# Patient Record
Sex: Female | Born: 1960
Health system: Southern US, Community
[De-identification: ages and names within clinical notes are randomized; demographics above are authoritative.]

## PROBLEM LIST (undated history)

## (undated) DIAGNOSIS — A6009 Herpesviral infection of other urogenital tract: Secondary | ICD-10-CM

## (undated) DIAGNOSIS — Z85828 Personal history of other malignant neoplasm of skin: Secondary | ICD-10-CM

## (undated) DIAGNOSIS — G4700 Insomnia, unspecified: Secondary | ICD-10-CM

## (undated) DIAGNOSIS — M94269 Chondromalacia, unspecified knee: Secondary | ICD-10-CM

## (undated) DIAGNOSIS — F319 Bipolar disorder, unspecified: Secondary | ICD-10-CM

## (undated) DIAGNOSIS — M1990 Unspecified osteoarthritis, unspecified site: Secondary | ICD-10-CM

## (undated) DIAGNOSIS — E079 Disorder of thyroid, unspecified: Secondary | ICD-10-CM

## (undated) DIAGNOSIS — M222X9 Patellofemoral disorders, unspecified knee: Secondary | ICD-10-CM

## (undated) DIAGNOSIS — E559 Vitamin D deficiency, unspecified: Secondary | ICD-10-CM

## (undated) HISTORY — PX: LAPAROSCOPIC OOPHERECTOMY: SHX6507

## (undated) HISTORY — DX: Disorder of thyroid, unspecified: E07.9

## (undated) HISTORY — DX: Patellofemoral disorders, unspecified knee: M22.2X9

## (undated) HISTORY — PX: TUBAL LIGATION: SHX77

## (undated) HISTORY — DX: Vitamin D deficiency, unspecified: E55.9

## (undated) HISTORY — DX: Personal history of other malignant neoplasm of skin: Z85.828

## (undated) HISTORY — DX: Bipolar disorder, unspecified: F31.9

## (undated) HISTORY — DX: Chondromalacia, unspecified knee: M94.269

## (undated) HISTORY — DX: Unspecified osteoarthritis, unspecified site: M19.90

## (undated) HISTORY — DX: Insomnia, unspecified: G47.00

## (undated) HISTORY — DX: Herpesviral infection of other urogenital tract: A60.09

## (undated) HISTORY — PX: AUGMENTATION MAMMAPLASTY: SUR837

---

## 1982-03-30 HISTORY — PX: BREAST SURGERY: SHX581

## 1999-12-17 ENCOUNTER — Other Ambulatory Visit: Admission: RE | Admit: 1999-12-17 | Discharge: 1999-12-17 | Payer: Self-pay | Admitting: *Deleted

## 2006-02-03 ENCOUNTER — Ambulatory Visit: Payer: Self-pay | Admitting: Internal Medicine

## 2009-05-15 ENCOUNTER — Ambulatory Visit: Payer: Self-pay | Admitting: Family Medicine

## 2009-09-18 DIAGNOSIS — E559 Vitamin D deficiency, unspecified: Secondary | ICD-10-CM | POA: Insufficient documentation

## 2010-12-11 ENCOUNTER — Ambulatory Visit: Payer: Self-pay | Admitting: Unknown Physician Specialty

## 2011-05-29 HISTORY — PX: BILATERAL OOPHORECTOMY: SHX1221

## 2011-06-03 ENCOUNTER — Ambulatory Visit: Payer: Self-pay | Admitting: Obstetrics and Gynecology

## 2011-06-03 LAB — PREGNANCY, URINE: Pregnancy Test, Urine: NEGATIVE m[IU]/mL

## 2011-06-03 LAB — BASIC METABOLIC PANEL
Anion Gap: 8 (ref 7–16)
Calcium, Total: 9.2 mg/dL (ref 8.5–10.1)
Chloride: 105 mmol/L (ref 98–107)
Co2: 27 mmol/L (ref 21–32)
EGFR (African American): >60
EGFR (Non-African Amer.): >60
Glucose: 85 mg/dL (ref 65–99)
Osmolality: 280 (ref 275–301)
Sodium: 140 mmol/L (ref 136–145)

## 2011-06-03 LAB — CBC
HCT: 40.7 % (ref 35.0–47.0)
HGB: 13.6 g/dL (ref 12.0–16.0)
MCV: 93 fL (ref 80–100)
Platelet: 238 10*3/uL (ref 150–440)
RBC: 4.39 10*6/uL (ref 3.80–5.20)
WBC: 7.2 10*3/uL (ref 3.6–11.0)

## 2011-06-16 ENCOUNTER — Ambulatory Visit: Payer: Self-pay | Admitting: Obstetrics and Gynecology

## 2011-06-19 LAB — PATHOLOGY REPORT

## 2012-07-27 LAB — LIPID PANEL
CHOLESTEROL: 208 mg/dL — AB (ref 0–200)
HDL: 80 mg/dL — AB (ref 35–70)
LDL Cholesterol: 115 mg/dL
TRIGLYCERIDES: 64 mg/dL (ref 40–160)

## 2013-01-10 DIAGNOSIS — Z85828 Personal history of other malignant neoplasm of skin: Secondary | ICD-10-CM | POA: Insufficient documentation

## 2013-01-10 DIAGNOSIS — C4401 Basal cell carcinoma of skin of lip: Secondary | ICD-10-CM | POA: Insufficient documentation

## 2013-01-28 HISTORY — PX: MOHS SURGERY: SUR867

## 2014-03-30 LAB — HM PAP SMEAR: HM Pap smear: NORMAL

## 2014-03-30 LAB — HM MAMMOGRAPHY: HM MAMMO: NORMAL

## 2014-11-29 ENCOUNTER — Ambulatory Visit (INDEPENDENT_AMBULATORY_CARE_PROVIDER_SITE_OTHER): Payer: PRIVATE HEALTH INSURANCE | Admitting: Family Medicine

## 2014-11-29 ENCOUNTER — Encounter (INDEPENDENT_AMBULATORY_CARE_PROVIDER_SITE_OTHER): Payer: Self-pay

## 2014-11-29 ENCOUNTER — Encounter: Payer: Self-pay | Admitting: Family Medicine

## 2014-11-29 VITALS — BP 114/68 | HR 93 | Temp 98.2°F | Resp 18 | Ht 64.0 in | Wt 170.8 lb

## 2014-11-29 DIAGNOSIS — E038 Other specified hypothyroidism: Secondary | ICD-10-CM

## 2014-11-29 DIAGNOSIS — Z85828 Personal history of other malignant neoplasm of skin: Secondary | ICD-10-CM

## 2014-11-29 DIAGNOSIS — Z1211 Encounter for screening for malignant neoplasm of colon: Secondary | ICD-10-CM | POA: Diagnosis not present

## 2014-11-29 DIAGNOSIS — Z13220 Encounter for screening for lipoid disorders: Secondary | ICD-10-CM

## 2014-11-29 DIAGNOSIS — E039 Hypothyroidism, unspecified: Secondary | ICD-10-CM | POA: Insufficient documentation

## 2014-11-29 DIAGNOSIS — R072 Precordial pain: Secondary | ICD-10-CM | POA: Diagnosis not present

## 2014-11-29 DIAGNOSIS — E663 Overweight: Secondary | ICD-10-CM | POA: Diagnosis not present

## 2014-11-29 DIAGNOSIS — Z780 Asymptomatic menopausal state: Secondary | ICD-10-CM | POA: Insufficient documentation

## 2014-11-29 DIAGNOSIS — F3342 Major depressive disorder, recurrent, in full remission: Secondary | ICD-10-CM | POA: Insufficient documentation

## 2014-11-29 DIAGNOSIS — M222X9 Patellofemoral disorders, unspecified knee: Secondary | ICD-10-CM | POA: Insufficient documentation

## 2014-11-29 DIAGNOSIS — E559 Vitamin D deficiency, unspecified: Secondary | ICD-10-CM | POA: Diagnosis not present

## 2014-11-29 DIAGNOSIS — F319 Bipolar disorder, unspecified: Secondary | ICD-10-CM

## 2014-11-29 DIAGNOSIS — Z23 Encounter for immunization: Secondary | ICD-10-CM

## 2014-11-29 DIAGNOSIS — M25569 Pain in unspecified knee: Secondary | ICD-10-CM | POA: Insufficient documentation

## 2014-11-29 DIAGNOSIS — R002 Palpitations: Secondary | ICD-10-CM | POA: Diagnosis not present

## 2014-11-29 DIAGNOSIS — A6000 Herpesviral infection of urogenital system, unspecified: Secondary | ICD-10-CM | POA: Insufficient documentation

## 2014-11-29 HISTORY — DX: Personal history of other malignant neoplasm of skin: Z85.828

## 2014-11-29 MED ORDER — ASPIRIN EC 81 MG PO TBEC: 81.0000 mg | DELAYED_RELEASE_TABLET | Freq: Every day | ORAL | Status: DC

## 2014-11-29 MED ORDER — CLONAZEPAM 0.5 MG PO TABS: 0.5000 mg | ORAL_TABLET | Freq: Two times a day (BID) | ORAL | Status: DC | PRN

## 2014-11-29 MED ORDER — BUPROPION HCL ER (XL) 150 MG PO TB24: 150.0000 mg | ORAL_TABLET | Freq: Every day | ORAL | Status: DC

## 2014-11-29 MED ORDER — ARIPIPRAZOLE 5 MG PO TABS: 5.0000 mg | ORAL_TABLET | Freq: Every day | ORAL | Status: DC

## 2014-11-29 NOTE — Progress Notes (Signed)
Name: Sandra Chandler   MRN: [id number]    DOB: 1961/03/19   Date:11/29/2014       Progress Note  Subjective  Chief Complaint  Chief Complaint  Patient presents with  . Medication Refill    4 month F/U  . Hypothyroidism    Unchanged but is having extremely Dry Skin  . 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 Patient has been very nervous and heart races constantly.     HPI  Hypothyroidism: taking Levothyroxine 50 mcg daily, she has noticed weight gain and dry skin, but denies constipation. She is compliant with medication   Bipolar Disorder: she lost her job about 2 months ago, abo. She is back working for the past week, difficulty staying focused, feeling overwhelmed, denies crying spells ( but cried in the office today) . She also feels very anxious. She was diagnosed with bipolar about 25 years ago and has not been on any medication since. She would like to try something that does not cause weight gain  Chest pain/palpitation: she states symptoms started about 6 weeks ago, after the loss of her job. She has been feeling very anxious. The pain is described as a discomfort with fluttering sensation in her anterior chest. No radiation to neck or shoulders/arms. No diaphoresis or nausea, no SOB. She has no previous history of cardiac disease. No family history of early MI  Patient Active Problem List   Diagnosis Date Noted  . Affective bipolar disorder 11/29/2014  . Genital herpes 11/29/2014  . History of basal cell cancer 11/29/2014  . Adult hypothyroidism 11/29/2014  . Menopause 11/29/2014  . Patella-femoral syndrome 11/29/2014  . Overweight (BMI 25.0-29.9) 11/29/2014  . Vitamin D deficiency 09/18/2009    Past Surgical History  Procedure Laterality Date  . Tubal ligation    . Cesarean section      x2  . Breast surgery  1984  . Bilateral oophorectomy  05/2011  . Mohs surgery  01/28/2013    Family History  Problem Relation Age of Onset  .  Osteoporosis Mother   . Bipolar disorder Father   . Cancer Father     Lung  . Bipolar disorder Brother     Social History   Social History  . Marital Status: Married    Spouse Name: N/A  . Number of Children: N/A  . Years of Education: N/A   Occupational History  . Not on file.   Social History Main Topics  . Smoking status: Never Smoker   . Smokeless tobacco: Never Used  . Alcohol Use: 0.0 oz/week    0 Standard drinks or equivalent per week     Comment: a little  . Drug Use: No  . Sexual Activity: No   Other Topics Concern  . Not on file   Social History Narrative     Current outpatient prescriptions:  .  clonazePAM (KLONOPIN) 0.5 MG tablet, Take 1 tablet (0.5 mg total) by mouth 2 (two) times daily as needed for anxiety., Disp: 60 tablet, Rfl: 0 .  levothyroxine (SYNTHROID, LEVOTHROID) 50 MCG tablet, Take by mouth., Disp: , Rfl:  .  ARIPiprazole (ABILIFY) 5 MG tablet, Take 1 tablet (5 mg total) by mouth daily., Disp: 30 tablet, Rfl: 0 .  buPROPion (WELLBUTRIN XL) 150 MG 24 hr tablet, Take 1 tablet (150 mg total) by mouth daily., Disp: 30 tablet, Rfl: 0  No Known Allergies   ROS  Constitutional: Negative for  fever or weight change.  Respiratory: Negative for cough and shortness of breath.   Cardiovascular: Positive  for chest pain or palpitations.  Gastrointestinal: Negative for abdominal pain, no bowel changes.  Musculoskeletal: Negative for gait problem or joint swelling.  Skin: Negative for rash.  Neurological: Negative for dizziness or headache.  No other specific complaints in a complete review of systems (except as listed in HPI above).  Objective  Filed Vitals:   11/29/14 0812  BP: 114/68  Pulse: 93  Temp: 98.2 F (36.8 C)  TempSrc: Oral  Resp: 18  Height: 5\' 4"  (1.626 m)  Weight: 170 lb 12.8 oz (77.474 kg)  SpO2: 96%    Body mass index is 29.3 kg/(m^2).  Physical Exam  Constitutional: Patient appears well-developed and well-nourished.   No distress.  HEENT: head atraumatic, normocephalic, pupils equal and reactive to light, neck supple, throat within normal limits Cardiovascular: Normal rate, regular rhythm and normal heart sounds.  No murmur heard. No BLE edema. Pulmonary/Chest: Effort normal and breath sounds normal. No respiratory distress. Abdominal: Soft.  There is no tenderness. Psychiatric: Patient has a normal mood and affect. behavior is normal. Judgment and thought content normal.  PHQ2/9: Depression screen PHQ 2/9 11/29/2014  Decreased Interest 0  Down, Depressed, Hopeless 0  PHQ - 2 Score 0     Fall Risk: Fall Risk  11/29/2014  Falls in the past year? No      Assessment & Plan  1. Other specified hypothyroidism  - TSH  2. Needs flu shot  - Flu Vaccine QUAD 36+ mos PF IM (Fluarix & Fluzone Quad PF)  3. Bipolar affective disorder, most recent episode unspecified type, remission status unspecified Explained the need for a mood stabilizer so she does not go manic - clonazePAM (KLONOPIN) 0.5 MG tablet; Take 1 tablet (0.5 mg total) by mouth 2 (two) times daily as needed for anxiety.  Dispense: 60 tablet; Refill: 0 - ARIPiprazole (ABILIFY) 5 MG tablet; Take 1 tablet (5 mg total) by mouth daily.  Dispense: 30 tablet; Refill: 0 - buPROPion (WELLBUTRIN XL) 150 MG 24 hr tablet; Take 1 tablet (150 mg total) by mouth daily.  Dispense: 30 tablet; Refill: 0  4. Vitamin D deficiency  - Vit D  25 hydroxy (rtn osteoporosis monitoring)  5. Lipid screening  - Lipid panel  6. Overweight (BMI 25.0-29.9) Discussed with the patient the risk posed by an increased BMI. Discussed importance of portion control, calorie counting and at least 150 minutes of physical activity weekly. Avoid sweet beverages and drink more water. Eat at least 6 servings of fruit and vegetables daily    7. Palpitation  - Comprehensive metabolic panel - TSH - CBC with Differential/Platelet - EKG 12-Lead  8. Colon cancer screening She  does not want to have a colonoscopy at this time because of cost - Cologuard   9. Precordial pain  With EKG changed, we will check Cardiac enzymes and refer to Cardiologist , start aspirin  - Troponin I - CK total and CKMB (cardiac)not at [facility] - Ambulatory referral to Cardiology

## 2014-11-30 LAB — LIPID PANEL
CHOLESTEROL TOTAL: 219 mg/dL — AB (ref 100–199)
Chol/HDL Ratio: 3.2 ratio units (ref 0.0–4.4)
HDL: 69 mg/dL (ref >39)
LDL CALC: 134 mg/dL — AB (ref 0–99)
Triglycerides: 82 mg/dL (ref 0–149)
VLDL CHOLESTEROL CAL: 16 mg/dL (ref 5–40)

## 2014-11-30 LAB — CBC WITH DIFFERENTIAL/PLATELET
BASOS ABS: 0 10*3/uL (ref 0.0–0.2)
Basos: 1 %
EOS (ABSOLUTE): 0.1 10*3/uL (ref 0.0–0.4)
Eos: 2 %
Hematocrit: 39.7 % (ref 34.0–46.6)
Hemoglobin: 13.3 g/dL (ref 11.1–15.9)
IMMATURE GRANS (ABS): 0 10*3/uL (ref 0.0–0.1)
IMMATURE GRANULOCYTES: 0 %
LYMPHS: 27 %
Lymphocytes Absolute: 1.7 10*3/uL (ref 0.7–3.1)
MCH: 29.8 pg (ref 26.6–33.0)
MCHC: 33.5 g/dL (ref 31.5–35.7)
MCV: 89 fL (ref 79–97)
MONOCYTES: 10 %
Monocytes Absolute: 0.6 10*3/uL (ref 0.1–0.9)
NEUTROS PCT: 60 %
Neutrophils Absolute: 3.7 10*3/uL (ref 1.4–7.0)
PLATELETS: 265 10*3/uL (ref 150–379)
RBC: 4.46 x10E6/uL (ref 3.77–5.28)
RDW: 13.5 % (ref 12.3–15.4)
WBC: 6.1 10*3/uL (ref 3.4–10.8)

## 2014-11-30 LAB — CK TOTAL AND CKMB (NOT AT ARMC)
CK MB INDEX: 1.3 ng/mL (ref 0.0–5.3)
CK TOTAL: 196 U/L — AB (ref 24–173)

## 2014-11-30 LAB — COMPREHENSIVE METABOLIC PANEL
ALK PHOS: 54 IU/L (ref 39–117)
ALT: 22 IU/L (ref 0–32)
AST: 28 IU/L (ref 0–40)
Albumin/Globulin Ratio: 1.7 (ref 1.1–2.5)
Albumin: 4.8 g/dL (ref 3.5–5.5)
BUN/Creatinine Ratio: 19 (ref 9–23)
BUN: 13 mg/dL (ref 6–24)
Bilirubin Total: 0.3 mg/dL (ref 0.0–1.2)
CALCIUM: 10.1 mg/dL (ref 8.7–10.2)
CO2: 23 mmol/L (ref 18–29)
CREATININE: 0.7 mg/dL (ref 0.57–1.00)
Chloride: 103 mmol/L (ref 97–108)
GFR calc Af Amer: 116 mL/min/{1.73_m2} (ref >59)
GFR, EST NON AFRICAN AMERICAN: 101 mL/min/{1.73_m2} (ref >59)
GLUCOSE: 86 mg/dL (ref 65–99)
Globulin, Total: 2.8 g/dL (ref 1.5–4.5)
Potassium: 4.6 mmol/L (ref 3.5–5.2)
Sodium: 141 mmol/L (ref 134–144)
Total Protein: 7.6 g/dL (ref 6.0–8.5)

## 2014-11-30 LAB — TROPONIN I

## 2014-11-30 LAB — VITAMIN D 25 HYDROXY (VIT D DEFICIENCY, FRACTURES): VIT D 25 HYDROXY: 29.8 ng/mL — AB (ref 30.0–100.0)

## 2014-11-30 LAB — TSH: TSH: 1.28 u[IU]/mL (ref 0.450–4.500)

## 2014-12-04 ENCOUNTER — Telehealth: Payer: Self-pay

## 2014-12-04 ENCOUNTER — Telehealth: Payer: Self-pay | Admitting: Family Medicine

## 2014-12-04 DIAGNOSIS — F3130 Bipolar disorder, current episode depressed, mild or moderate severity, unspecified: Secondary | ICD-10-CM

## 2014-12-04 MED ORDER — DIVALPROEX SODIUM 250 MG PO DR TAB: 250.0000 mg | DELAYED_RELEASE_TABLET | Freq: Every evening | ORAL | Status: DC

## 2014-12-04 NOTE — Telephone Encounter (Signed)
Pt is requesting a call back about her lab results.

## 2014-12-04 NOTE — Telephone Encounter (Signed)
Patient states she can not afford the Abilify even with the coupon her Insurance will charge her $600, and wondered if she could just take the Wellbutrin and Klonopin to help with her moods.

## 2014-12-04 NOTE — Telephone Encounter (Signed)
I will send Depakote 250mg  to take qhs, it will help with her mood and it will be unexpensive

## 2014-12-04 NOTE — Telephone Encounter (Signed)
Called and gave blood work results

## 2014-12-05 NOTE — Telephone Encounter (Signed)
Patient informed and was okay with that. Thank you

## 2014-12-11 ENCOUNTER — Other Ambulatory Visit: Payer: Self-pay | Admitting: Family Medicine

## 2014-12-11 NOTE — Telephone Encounter (Signed)
Patient requesting refill. 

## 2014-12-29 ENCOUNTER — Other Ambulatory Visit: Payer: Self-pay | Admitting: Family Medicine

## 2014-12-31 ENCOUNTER — Other Ambulatory Visit: Payer: Self-pay | Admitting: Family Medicine

## 2014-12-31 NOTE — Telephone Encounter (Signed)
Left voice message to inform patient °

## 2015-01-01 NOTE — Telephone Encounter (Signed)
Patient has appointment for 01/24/15

## 2015-01-24 ENCOUNTER — Ambulatory Visit: Payer: PRIVATE HEALTH INSURANCE | Admitting: Family Medicine

## 2015-01-26 ENCOUNTER — Other Ambulatory Visit: Payer: Self-pay | Admitting: Family Medicine

## 2015-02-05 ENCOUNTER — Ambulatory Visit: Payer: PRIVATE HEALTH INSURANCE | Admitting: Family Medicine

## 2015-03-20 ENCOUNTER — Ambulatory Visit (INDEPENDENT_AMBULATORY_CARE_PROVIDER_SITE_OTHER): Payer: PRIVATE HEALTH INSURANCE | Admitting: Family Medicine

## 2015-03-20 ENCOUNTER — Encounter: Payer: Self-pay | Admitting: Family Medicine

## 2015-03-20 VITALS — BP 118/76 | HR 88 | Temp 97.5°F | Resp 16 | Wt 167.0 lb

## 2015-03-20 DIAGNOSIS — F319 Bipolar disorder, unspecified: Secondary | ICD-10-CM | POA: Insufficient documentation

## 2015-03-20 DIAGNOSIS — F3342 Major depressive disorder, recurrent, in full remission: Secondary | ICD-10-CM

## 2015-03-20 DIAGNOSIS — E034 Atrophy of thyroid (acquired): Secondary | ICD-10-CM | POA: Diagnosis not present

## 2015-03-20 DIAGNOSIS — E038 Other specified hypothyroidism: Secondary | ICD-10-CM

## 2015-03-20 DIAGNOSIS — J40 Bronchitis, not specified as acute or chronic: Secondary | ICD-10-CM | POA: Diagnosis not present

## 2015-03-20 DIAGNOSIS — J209 Acute bronchitis, unspecified: Secondary | ICD-10-CM

## 2015-03-20 MED ORDER — CLONAZEPAM 0.5 MG PO TABS
0.5000 mg | ORAL_TABLET | Freq: Two times a day (BID) | ORAL | Status: DC | PRN
Start: 2015-03-20 — End: 2015-12-03

## 2015-03-20 MED ORDER — LEVOTHYROXINE SODIUM 50 MCG PO TABS: 50.0000 ug | ORAL_TABLET | Freq: Every day | ORAL | Status: DC

## 2015-03-20 MED ORDER — AMOXICILLIN-POT CLAVULANATE 875-125 MG PO TABS: 1.0000 | ORAL_TABLET | Freq: Two times a day (BID) | ORAL | Status: DC

## 2015-03-20 MED ORDER — HYDROCOD POLST-CPM POLST ER 10-8 MG/5ML PO SUER: 5.0000 mL | Freq: Two times a day (BID) | ORAL | Status: DC | PRN

## 2015-03-20 NOTE — Progress Notes (Signed)
Name: Sandra Chandler   MRN: [id number]    DOB: 10-Jul-1960   Date:03/20/2015       Progress Note  Subjective  Chief Complaint  Chief Complaint  Patient presents with  . Bronchitis    patient presents with productive cough that has been going on for about 3 weeks.  . Medication Refill    Klonopin 0.5mg     HPI  Patient is here today with concerns regarding the following symptoms congestion, post nasal drip, sneezing, ear pressure, sinus pressure, non productive cough and productive cough that started about 3 weeks ago.  Associated with fatigue. Not associated with fevers, rash, headaches, nausea, diarrhea.  Has tried the following home remedies: NyQuil, DayQuil, Mucinex D, Aleve and some home remedies (night cap). Symptoms persist despite this. No asthma as a child. Not a smoker.   Is requesting refill of her Clonazepam 0.5 mg today for her anxiety management. Using medication sparingly, not daily. Is no longer taking Wellbutrin XL 150 mg or Depakote 250 mg ER. Reports moods as being stable.   Has new insurance coverage which is not very good. Will be getting better insurance next month and is willing to return then for thyroid hormone check. For now needs refill of levothyroxine as well. No symptoms reported. TSH last checked 11/29/2014.   Past Medical History  Diagnosis Date  . Herpes genitalis in women   . Depressed bipolar I disorder ([place])   . Insomnia   . Thyroid disease   . Patellofemoral stress syndrome   . Chondromalacia of knee   . Vitamin D deficiency   . Hx of basal cell carcinoma     Social History  Substance Use Topics  . Smoking status: Never Smoker   . Smokeless tobacco: Never Used  . Alcohol Use: 0.0 oz/week    0 Standard drinks or equivalent per week     Comment: a little     Current outpatient prescriptions:  .  levothyroxine (SYNTHROID, LEVOTHROID) 50 MCG tablet, TAKE 1 TABLET BY MOUTH DAILY, Disp: 30 tablet, Rfl: 5 .  aspirin 81 MG EC tablet, TAKE 1  TABLET (81 MG TOTAL) BY MOUTH DAILY. (Patient not taking: Reported on 03/20/2015), Disp: 30 tablet, Rfl: 5 .  buPROPion (WELLBUTRIN XL) 150 MG 24 hr tablet, TAKE 1 TABLET (150 MG TOTAL) BY MOUTH DAILY. (Patient not taking: Reported on 03/20/2015), Disp: 30 tablet, Rfl: 0 .  buPROPion (WELLBUTRIN XL) 150 MG 24 hr tablet, TAKE 1 TABLET (150 MG TOTAL) BY MOUTH DAILY. (Patient not taking: Reported on 03/20/2015), Disp: 30 tablet, Rfl: 1 .  clonazePAM (KLONOPIN) 0.5 MG tablet, Take 1 tablet (0.5 mg total) by mouth 2 (two) times daily as needed for anxiety. (Patient not taking: Reported on 03/20/2015), Disp: 60 tablet, Rfl: 0 .  divalproex (DEPAKOTE) 250 MG DR tablet, Take 1 tablet (250 mg total) by mouth every evening. (Patient not taking: Reported on 03/20/2015), Disp: 30 tablet, Rfl: 2  No Known Allergies  ROS  CONSTITUTIONAL: No significant weight changes, fever, chills, weakness. Yes fatigue.  HEENT:  - Eyes: No visual changes.  - Ears: No auditory changes. No pain.  - Nose: Yes congestion and sinus pressure - Throat: No sore throat. No changes in swallowing. SKIN: No rash or itching.  CARDIOVASCULAR: No chest pain, chest pressure or chest discomfort. No palpitations or edema.  RESPIRATORY: No shortness of breath. Yes cough without sputum.  GASTROINTESTINAL: No anorexia, nausea, vomiting. No changes in bowel habits. No abdominal pain or blood.  PSYCHIATRIC: No change in mood. No change in sleep pattern.  ENDOCRINOLOGIC: No reports of sweating, cold or heat intolerance. No polyuria or polydipsia.      Objective  Filed Vitals:   03/20/15 1557  BP: 118/76  Pulse: 88  Temp: 97.5 F (36.4 C)  TempSrc: Oral  Resp: 16  Weight: 167 lb (75.751 kg)  SpO2: 98%   Body mass index is 28.65 kg/(m^2).   Physical Exam  Constitutional: Patient appears well-developed and well-nourished. In no acute distress but does appear to be fatigued from acute illness. HEENT:  - Head: Normocephalic  and atraumatic.  - Ears: RIGHT TM bulging with minimal clear exudate, LEFT TM bulging with minimal clear exudate.  - Nose: Nasal mucosa boggy and congested.  - Mouth/Throat: Oropharynx is moist with slight erythema of bilateral tonsils without hypertrophy or exudates. Post nasal drainage present.  - Eyes: Conjunctivae clear, EOM movements normal. PERRLA. No scleral icterus.  Neck: Normal range of motion. Neck supple. No JVD present. No thyromegaly present. No local lymphadenopathy. Cardiovascular: Regular rate, regular rhythm with no murmurs heard.  Pulmonary/Chest: Effort normal and breath sounds clear in all mid to lower lung fields with upper anterior airway rhonchi.  Musculoskeletal: Normal range of motion bilateral UE and LE, no joint effusions. Skin: Skin is warm and dry. No rash noted. Psychiatric: Patient has a normal mood and affect. Behavior is normal in office today. Judgment and thought content normal in office today.   Assessment & Plan  1. Bronchitis with bronchospasm Etiologies include initial allergic rhinitis or viral infection progressing to superimposed bacterial infection. Instructed patient on increasing hydration, nasal saline spray, steam inhalation, NSAID if tolerated and not contraindicated. If not already doing so start taking daily anti-histamine and use a steroid nasal spray. If symptoms persist/worsen may consider antibiotic therapy.  - amoxicillin-clavulanate (AUGMENTIN) 875-125 MG tablet; Take 1 tablet by mouth 2 (two) times daily.  Dispense: 20 tablet; Refill: 0 - chlorpheniramine-HYDROcodone (TUSSIONEX PENNKINETIC ER) 10-8 MG/5ML SUER; Take 5 mLs by mouth every 12 (twelve) hours as needed for cough.  Dispense: 115 mL; Refill: 0  2. Hypothyroidism due to acquired atrophy of thyroid Refilled.   - levothyroxine (SYNTHROID, LEVOTHROID) 50 MCG tablet; Take 1 tablet (50 mcg total) by mouth daily.  Dispense: 30 tablet; Refill: 1  3. Major depressive disorder,  recurrent, in full remission with anxious distress ([place]) Refilled.  - clonazePAM (KLONOPIN) 0.5 MG tablet; Take 1 tablet (0.5 mg total) by mouth 2 (two) times daily as needed for anxiety.  Dispense: 60 tablet; Refill: 0

## 2015-04-02 ENCOUNTER — Telehealth: Payer: Self-pay | Admitting: Family Medicine

## 2015-04-02 NOTE — Telephone Encounter (Signed)
Please see if patient can come in to be seen

## 2015-04-02 NOTE — Telephone Encounter (Signed)
Pt informed about an appt or trying OTC meds and pt will try OTC meds first.

## 2015-04-02 NOTE — Telephone Encounter (Signed)
Requesting a zpack for sinus. Going on for about a month. Was seen the other week for bronchitis and was given antibiotic it did clear that up however now the sinus is messing up. Facial pain, sneezing, congestion, clear mucus. Please send [facility]

## 2015-04-02 NOTE — Telephone Encounter (Signed)
Needs to be seen, I don't call in antibiotics.  She can try cold and sinus medication and saline spray if she does not want to come in.  Thank you

## 2015-04-03 ENCOUNTER — Other Ambulatory Visit: Payer: Self-pay | Admitting: Family Medicine

## 2015-04-03 ENCOUNTER — Telehealth: Payer: Self-pay | Admitting: Family Medicine

## 2015-04-03 MED ORDER — AZITHROMYCIN 250 MG PO TABS: ORAL_TABLET | ORAL | Status: DC

## 2015-04-03 NOTE — Telephone Encounter (Signed)
Pt would like call back

## 2015-04-03 NOTE — Telephone Encounter (Signed)
Zpack called in.

## 2015-04-03 NOTE — Telephone Encounter (Signed)
Pt notified about RX

## 2015-04-03 NOTE — Telephone Encounter (Signed)
Patient wants to know if you could call in a Z-pak patient, has taken all the antibiotic you prescribed and cough, nasal spray, mucinex dm medications. Also has no insurance due to starting a new job recently and has had Z-pak before knock out her sinus infections and asking you please call this in for her. She states she has tried everything and came in to see Dr. Nadine Counts on 03/20/2015.

## 2015-08-08 ENCOUNTER — Other Ambulatory Visit: Payer: Self-pay | Admitting: Family Medicine

## 2015-08-08 NOTE — Telephone Encounter (Signed)
Left message for patient to schedule appointment and that her prescription is at the pharmacy

## 2015-09-06 ENCOUNTER — Other Ambulatory Visit: Payer: Self-pay | Admitting: Family Medicine

## 2015-11-13 DIAGNOSIS — H5203 Hypermetropia, bilateral: Secondary | ICD-10-CM | POA: Diagnosis not present

## 2015-12-03 ENCOUNTER — Encounter: Payer: Self-pay | Admitting: Family Medicine

## 2015-12-03 ENCOUNTER — Ambulatory Visit (INDEPENDENT_AMBULATORY_CARE_PROVIDER_SITE_OTHER): Payer: BLUE CROSS/BLUE SHIELD | Admitting: Family Medicine

## 2015-12-03 VITALS — BP 114/70 | HR 81 | Temp 97.7°F | Resp 18 | Ht 63.0 in | Wt 157.0 lb

## 2015-12-03 DIAGNOSIS — F3130 Bipolar disorder, current episode depressed, mild or moderate severity, unspecified: Secondary | ICD-10-CM

## 2015-12-03 DIAGNOSIS — E038 Other specified hypothyroidism: Secondary | ICD-10-CM | POA: Diagnosis not present

## 2015-12-03 DIAGNOSIS — B001 Herpesviral vesicular dermatitis: Secondary | ICD-10-CM | POA: Diagnosis not present

## 2015-12-03 DIAGNOSIS — E663 Overweight: Secondary | ICD-10-CM | POA: Diagnosis not present

## 2015-12-03 DIAGNOSIS — E785 Hyperlipidemia, unspecified: Secondary | ICD-10-CM

## 2015-12-03 DIAGNOSIS — Z79899 Other long term (current) drug therapy: Secondary | ICD-10-CM

## 2015-12-03 DIAGNOSIS — A6000 Herpesviral infection of urogenital system, unspecified: Secondary | ICD-10-CM

## 2015-12-03 DIAGNOSIS — G4700 Insomnia, unspecified: Secondary | ICD-10-CM

## 2015-12-03 DIAGNOSIS — E559 Vitamin D deficiency, unspecified: Secondary | ICD-10-CM | POA: Diagnosis not present

## 2015-12-03 DIAGNOSIS — Z23 Encounter for immunization: Secondary | ICD-10-CM | POA: Diagnosis not present

## 2015-12-03 DIAGNOSIS — E034 Atrophy of thyroid (acquired): Secondary | ICD-10-CM | POA: Diagnosis not present

## 2015-12-03 MED ORDER — CLONAZEPAM 0.5 MG PO TABS: 0.5000 mg | ORAL_TABLET | Freq: Two times a day (BID) | ORAL | 0 refills | Status: DC | PRN

## 2015-12-03 MED ORDER — VALACYCLOVIR HCL 500 MG PO TABS
500.0000 mg | ORAL_TABLET | Freq: Every day | ORAL | 12 refills | Status: DC
Start: 2015-12-03 — End: 2016-11-25

## 2015-12-03 NOTE — Progress Notes (Addendum)
Name: Sandra Chandler   MRN: [id number]    DOB: 1961/03/08   Date:12/03/2015       Progress Note  Subjective  Chief Complaint  Chief Complaint  Patient presents with  . Medication Refill    1 year since last visit due to losing Insurance  . Hypothyroidism    Patient is experience hair loss, dry skin, cold intolerance, can't lose weight  . 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     HPI  Hypothyroidism: taking Levothyroxine 50 mcg daily, she has no palpitation, skin is still dry. She is due for TSH level  Bipolar Disorder: she lost her job in July 2017, and she was very upset at the time and emotional, she worked temporarily for a [employer], than [employer] part time, and for the past 6 months she has been with [employer]. Works by herself, but long hours 10 hours daily 5 days a week. She was diagnosed with bipolar over 25 years ago, but does not like taking medications for it. She denies depression or manic episodes recently. Dating for the past 6 months  Overweight: she has been cycling between 40-100 miles per week, she has lost 12 lbs in the past year. Explained that she needs to have a balanced diet and avoid skipping meals ago.   Insomnia: she takes Klonopin occasionally just for sleep, very seldom for anxiety, she would like a refill. Discussed other sleeping aids, but she prefers to continue on this medication prn.   Patient Active Problem List   Diagnosis Date Noted  . Bipolar affective disorder, most recent episode unspecified type, remission status unspecified 03/20/2015  . Bipolar affective disorder depressed partial or unspecified remission 03/20/2015  . Major depressive disorder, recurrent, in full remission with anxious distress ([place]) 11/29/2014  . Genital herpes 11/29/2014  . History of basal cell cancer 11/29/2014  . Adult hypothyroidism 11/29/2014  . Menopause 11/29/2014  . Patella-femoral syndrome 11/29/2014  . Overweight  11/29/2014  . Basal cell carcinoma of lip 01/10/2013  . Vitamin D deficiency 09/18/2009    Past Surgical History:  Procedure Laterality Date  . BILATERAL OOPHORECTOMY  05/2011  . BREAST SURGERY  1984  . CESAREAN SECTION     x2  . MOHS SURGERY  01/28/2013  . TUBAL LIGATION      Family History  Problem Relation Age of Onset  . Osteoporosis Mother   . Bipolar disorder Father   . Cancer Father     Lung  . Bipolar disorder Brother     Social History   Social History  . Marital status: Divorced    Spouse name: N/A  . Number of children: 2  . Years of education: N/A   Occupational History  . shipping and receiving  [employer]   Social History Main Topics  . Smoking status: Never Smoker  . Smokeless tobacco: Never Used  . Alcohol use 0.0 oz/week     Comment: a little  . Drug use: No  . Sexual activity: No   Other Topics Concern  . Not on file   Social History Narrative   Daughter still lives with her.    She is dating     Current Outpatient Prescriptions:  .  clonazePAM (KLONOPIN) 0.5 MG tablet, Take 1 tablet (0.5 mg total) by mouth 2 (two) times daily as needed for anxiety. Or sleep, Disp: 30 tablet, Rfl: 0 .  levothyroxine (SYNTHROID, LEVOTHROID) 50 MCG tablet, TAKE  1 TABLET BY MOUTH DAILY, Disp: 30 tablet, Rfl: 2  No Known Allergies   ROS  Constitutional: Negative for fever, positive for weight change.  Respiratory: Negative for cough and shortness of breath.   Cardiovascular: Negative for chest pain or palpitations.  Gastrointestinal: Negative for abdominal pain, no bowel changes.  Musculoskeletal: Negative for gait problem or joint swelling.  Skin: Negative for rash.  Neurological: Negative for dizziness or headache.  No other specific complaints in a complete review of systems (except as listed in HPI above).   Objective  Vitals:   12/03/15 1349  BP: 114/70  Pulse: 81  Resp: 18  Temp: 97.7 F (36.5 C)  TempSrc: Oral  SpO2: 97%  Weight: 157 lb  (71.2 kg)  Height: 5\' 3"  (1.6 m)    Body mass index is 27.81 kg/m.  Physical Exam  Constitutional: Patient appears well-developed and well-nourished. No distress.  HEENT: head atraumatic, normocephalic, pupils equal and reactive to light,  neck supple, throat within normal limits Cardiovascular: Normal rate, regular rhythm and normal heart sounds.  No murmur heard. No BLE edema. Pulmonary/Chest: Effort normal and breath sounds normal. No respiratory distress. Abdominal: Soft.  There is no tenderness. Psychiatric: Patient has a normal mood and affect. behavior is normal. Judgment and thought content normal.  PHQ2/9: Depression screen [facility] 2/9 12/03/2015 03/20/2015 11/29/2014  Decreased Interest 0 0 0  Down, Depressed, Hopeless 0 0 0  PHQ - 2 Score 0 0 0    Fall Risk: Fall Risk  12/03/2015 03/20/2015 11/29/2014  Falls in the past year? No No No    Functional Status Survey: Is the patient deaf or have difficulty hearing?: No Does the patient have difficulty concentrating, remembering, or making decisions?: No Does the patient have difficulty walking or climbing stairs?: No Does the patient have difficulty dressing or bathing?: No Does the patient have difficulty doing errands alone such as visiting a doctor's office or shopping?: No    Assessment & Plan  1. Bipolar disorder, most recent episode depressed, remission status unspecified  stable  2. Other specified hypothyroidism  - TSH  3. Vitamin D deficiency  She would like to hold off on labs   4. Overweight (BMI 25.0-29.9)  Losing weight, doing well   5. Insomnia  - clonazePAM (KLONOPIN) 0.5 MG tablet; Take 1 tablet (0.5 mg total) by mouth 2 (two) times daily as needed for anxiety. Or sleep  Dispense: 30 tablet; Refill: 0  6. Dyslipidemia  - Lipid panel  7. Long-term use of high-risk medication  - COMPLETE METABOLIC PANEL WITH GFR  8. Genital herpes  Not sexually active, but dating, he is older  -  valACYclovir (VALTREX) 500 MG tablet; Take 1 tablet (500 mg total) by mouth daily. And three times daily prn outbreak  Dispense: 35 tablet; Refill: 12  9. Fever blister  - valACYclovir (VALTREX) 500 MG tablet; Take 1 tablet (500 mg total) by mouth daily. And three times daily prn outbreak  Dispense: 35 tablet; Refill: 12   10. Needs flu shot  - Flu Vaccine QUAD 36+ mos IM

## 2015-12-03 NOTE — Addendum Note (Signed)
Addended by: Steele Sizer F on: 12/03/2015 02:21 PM   Modules accepted: Orders

## 2015-12-04 LAB — COMPLETE METABOLIC PANEL WITH GFR
ALT: 28 U/L (ref 6–29)
AST: 30 U/L (ref 10–35)
Albumin: 4.8 g/dL (ref 3.6–5.1)
Alkaline Phosphatase: 53 U/L (ref 33–130)
BILIRUBIN TOTAL: 0.5 mg/dL (ref 0.2–1.2)
BUN: 12 mg/dL (ref 7–25)
CHLORIDE: 101 mmol/L (ref 98–110)
CO2: 22 mmol/L (ref 20–31)
Calcium: 10.2 mg/dL (ref 8.6–10.4)
Creat: 0.68 mg/dL (ref 0.50–1.05)
GLUCOSE: 80 mg/dL (ref 65–99)
POTASSIUM: 4.3 mmol/L (ref 3.5–5.3)
SODIUM: 139 mmol/L (ref 135–146)
Total Protein: 7.9 g/dL (ref 6.1–8.1)

## 2015-12-04 LAB — TSH: TSH: 1.11 mIU/L

## 2015-12-04 LAB — LIPID PANEL
CHOL/HDL RATIO: 2.9 ratio (ref <=5.0)
Cholesterol: 220 mg/dL — ABNORMAL HIGH (ref 125–200)
HDL: 75 mg/dL (ref >=46)
LDL CALC: 130 mg/dL — AB (ref <130)
TRIGLYCERIDES: 73 mg/dL (ref <150)
VLDL: 15 mg/dL (ref <30)

## 2015-12-29 ENCOUNTER — Other Ambulatory Visit: Payer: Self-pay | Admitting: Family Medicine

## 2016-02-06 ENCOUNTER — Other Ambulatory Visit: Payer: Self-pay | Admitting: Family Medicine

## 2016-02-06 ENCOUNTER — Telehealth: Payer: Self-pay | Admitting: Family Medicine

## 2016-02-06 DIAGNOSIS — Z1239 Encounter for other screening for malignant neoplasm of breast: Secondary | ICD-10-CM

## 2016-02-06 NOTE — Telephone Encounter (Signed)
Pt states she needs a mammogram done and she has to have a referral. She states she does not want to go to [place] and would like a call back about a recommendation.

## 2016-03-04 ENCOUNTER — Ambulatory Visit: Payer: BLUE CROSS/BLUE SHIELD | Admitting: Family Medicine

## 2016-03-17 ENCOUNTER — Other Ambulatory Visit: Payer: Self-pay

## 2016-03-17 MED ORDER — LEVOTHYROXINE SODIUM 50 MCG PO TABS: 50.0000 ug | ORAL_TABLET | Freq: Every day | ORAL | 0 refills | Status: DC

## 2016-03-17 NOTE — Telephone Encounter (Signed)
[facility]  

## 2016-03-19 ENCOUNTER — Other Ambulatory Visit: Payer: Self-pay

## 2016-03-19 NOTE — Telephone Encounter (Signed)
Patient requesting refill of Levothyroxine for 90 day instead of 30.

## 2016-03-24 ENCOUNTER — Ambulatory Visit: Payer: Self-pay | Attending: Family Medicine

## 2016-04-03 ENCOUNTER — Ambulatory Visit (INDEPENDENT_AMBULATORY_CARE_PROVIDER_SITE_OTHER): Payer: BLUE CROSS/BLUE SHIELD | Admitting: Family Medicine

## 2016-04-03 ENCOUNTER — Encounter: Payer: Self-pay | Admitting: Family Medicine

## 2016-04-03 VITALS — BP 108/62 | HR 93 | Temp 98.3°F | Resp 18 | Wt 160.8 lb

## 2016-04-03 DIAGNOSIS — E038 Other specified hypothyroidism: Secondary | ICD-10-CM | POA: Diagnosis not present

## 2016-04-03 DIAGNOSIS — J0100 Acute maxillary sinusitis, unspecified: Secondary | ICD-10-CM

## 2016-04-03 DIAGNOSIS — E785 Hyperlipidemia, unspecified: Secondary | ICD-10-CM | POA: Diagnosis not present

## 2016-04-03 DIAGNOSIS — J40 Bronchitis, not specified as acute or chronic: Secondary | ICD-10-CM

## 2016-04-03 DIAGNOSIS — F3342 Major depressive disorder, recurrent, in full remission: Secondary | ICD-10-CM | POA: Diagnosis not present

## 2016-04-03 MED ORDER — FLUTICASONE FUROATE-VILANTEROL 100-25 MCG/INH IN AEPB: 1.0000 | INHALATION_SPRAY | Freq: Every day | RESPIRATORY_TRACT | 0 refills | Status: DC

## 2016-04-03 MED ORDER — AZITHROMYCIN 500 MG PO TABS: 500.0000 mg | ORAL_TABLET | Freq: Every day | ORAL | 0 refills | Status: DC

## 2016-04-03 MED ORDER — LEVOTHYROXINE SODIUM 50 MCG PO TABS: 50.0000 ug | ORAL_TABLET | Freq: Every day | ORAL | 1 refills | Status: DC

## 2016-04-03 MED ORDER — CLONAZEPAM 0.5 MG PO TABS: 0.5000 mg | ORAL_TABLET | Freq: Two times a day (BID) | ORAL | 0 refills | Status: DC | PRN

## 2016-04-03 NOTE — Progress Notes (Addendum)
Name: Sandra Chandler   MRN: [id number]    DOB: 08/13/1960   Date:04/03/2016       Progress Note  Subjective  Chief Complaint  Chief Complaint  Patient presents with  . URI    Onset- Since February 28, 2016, Facial Pain and Pressure, ear pressure/pain, eyes hurt, sinus drainage. Patient has tried Ibuprofen with no relief, feels tired and achy all over  . Medication Refill    HPI  URI/sinusitis/bronchitis: she has been sick for the past 5 weeks, initially cold symptoms, followed by post-nasal drainage, facial pressure, and over the past few days she noticed chest congestion, cough that can dry or wet and some SOB. No fever or chills. She is feeling tired because of it. Mild lack of appetite because of change in taste  Hypothyroidism: taking medication daily, past two TSH's within normal limits, no constipation or increase in dry skin  Major Depression: in remission, but still has anxiety and would like a refill of Klonopin  History of basal cell with a lesion on lower lip: explained importance of going back to Dermatologist for biopsy   Patient Active Problem List   Diagnosis Date Noted  . Bipolar affective disorder, most recent episode unspecified type, remission status unspecified 03/20/2015  . Major depressive disorder, recurrent, in full remission with anxious distress ([place]) 11/29/2014  . Genital herpes 11/29/2014  . History of basal cell cancer 11/29/2014  . Adult hypothyroidism 11/29/2014  . Menopause 11/29/2014  . Patella-femoral syndrome 11/29/2014  . Overweight 11/29/2014  . History of basal cell carcinoma 01/10/2013  . Vitamin D deficiency 09/18/2009    Past Surgical History:  Procedure Laterality Date  . BILATERAL OOPHORECTOMY  05/2011  . BREAST SURGERY  1984  . CESAREAN SECTION     x2  . MOHS SURGERY  01/28/2013  . TUBAL LIGATION      Family History  Problem Relation Age of Onset  . Osteoporosis Mother   . Bipolar disorder Father   . Cancer Father     Lung   . Bipolar disorder Brother     Social History   Social History  . Marital status: Divorced    Spouse name: N/A  . Number of children: 2  . Years of education: N/A   Occupational History  . shipping and receiving      [employer]   Social History Main Topics  . Smoking status: Never Smoker  . Smokeless tobacco: Never Used  . Alcohol use 0.0 oz/week     Comment: a little  . Drug use: No  . Sexual activity: No   Other Topics Concern  . Not on file   Social History Narrative   She lives alone. Divorced, has two grown children. Dating.      Current Outpatient Prescriptions:  .  clonazePAM (KLONOPIN) 0.5 MG tablet, Take 1 tablet (0.5 mg total) by mouth 2 (two) times daily as needed for anxiety. Or sleep, Disp: 30 tablet, Rfl: 0 .  levothyroxine (SYNTHROID, LEVOTHROID) 50 MCG tablet, Take 1 tablet (50 mcg total) by mouth daily., Disp: 90 tablet, Rfl: 1 .  valACYclovir (VALTREX) 500 MG tablet, Take 1 tablet (500 mg total) by mouth daily. And three times daily prn outbreak, Disp: 35 tablet, Rfl: 12 .  azithromycin (ZITHROMAX) 500 MG tablet, Take 1 tablet (500 mg total) by mouth daily., Disp: 3 tablet, Rfl: 0 .  fluticasone furoate-vilanterol (BREO ELLIPTA) 100-25 MCG/INH AEPB, Inhale 1 puff into the lungs daily., Disp: 60 each, Rfl: 0  No Known Allergies   ROS  Constitutional: Negative for fever or weight change.  Respiratory: Positive  for cough and  shortness of breath.   Cardiovascular: Negative for chest pain or palpitations.  Gastrointestinal: Negative for abdominal pain, no bowel changes.  Musculoskeletal: Negative for gait problem or joint swelling.  Skin: Negative for rash.  Neurological: Negative for dizziness or headache.  No other specific complaints in a complete review of systems (except as listed in HPI above).  Objective  Vitals:   04/03/16 1408  BP: 108/62  Pulse: 93  Resp: 18  Temp: 98.3 F (36.8 C)  TempSrc: Oral  SpO2: 96%  Weight: 160 lb 12.8 oz  (72.9 kg)    Body mass index is 28.48 kg/m.  Physical Exam  Constitutional: Patient appears well-developed and well-nourished. No distress.  HEENT: head atraumatic, normocephalic, pupils equal and reactive to light, ears normal TM bilaterally, white post-nasal drainage neck supple, throat within normal limits Cardiovascular: Normal rate, regular rhythm and normal heart sounds.  No murmur heard. No BLE edema. Pulmonary/Chest: Effort normal and breath sounds normal. No respiratory distress. Abdominal: Soft.  There is no tenderness. Psychiatric: Patient has a normal mood and affect. behavior is normal. Judgment and thought content normal.  PHQ2/9: Depression screen [facility] 2/9 04/03/2016 12/03/2015 03/20/2015 11/29/2014  Decreased Interest 0 0 0 0  Down, Depressed, Hopeless 0 0 0 0  PHQ - 2 Score 0 0 0 0     Fall Risk: Fall Risk  04/03/2016 12/03/2015 03/20/2015 11/29/2014  Falls in the past year? No No No No    Functional Status Survey: Is the patient deaf or have difficulty hearing?: No Does the patient have difficulty seeing, even when wearing glasses/contacts?: No Does the patient have difficulty concentrating, remembering, or making decisions?: No Does the patient have difficulty walking or climbing stairs?: No Does the patient have difficulty dressing or bathing?: No Does the patient have difficulty doing errands alone such as visiting a doctor's office or shopping?: No    Assessment & Plan  1. Other specified hypothyroidism  - levothyroxine (SYNTHROID, LEVOTHROID) 50 MCG tablet; Take 1 tablet (50 mcg total) by mouth daily.  Dispense: 90 tablet; Refill: 1  2. Dyslipidemia  On life style modification  3. Major depressive disorder, recurrent, in full remission with anxious distress ([place])  Doing well, not having problems at this time - clonazePAM (KLONOPIN) 0.5 MG tablet; Take 1 tablet (0.5 mg total) by mouth 2 (two) times daily as needed for anxiety. Or sleep  Dispense: 30 tablet;  Refill: 0  4. Subacute maxillary sinusitis   she prefers short term duration of medication - azithromycin (ZITHROMAX) 500 MG tablet; Take 1 tablet (500 mg total) by mouth daily.  Dispense: 3 tablet; Refill: 0  5. Bronchitis  - azithromycin (ZITHROMAX) 500 MG tablet; Take 1 tablet (500 mg total) by mouth daily.  Dispense: 3 tablet; Refill: 0 - fluticasone furoate-vilanterol (BREO ELLIPTA) 100-25 MCG/INH AEPB; Inhale 1 puff into the lungs daily.  Dispense: 60 each; Refill: 0

## 2016-04-03 NOTE — Addendum Note (Signed)
Addended by: Steele Sizer F on: 04/03/2016 02:34 PM   Modules accepted: Orders

## 2016-04-06 ENCOUNTER — Other Ambulatory Visit: Payer: Self-pay | Admitting: Family Medicine

## 2016-04-06 ENCOUNTER — Telehealth: Payer: Self-pay | Admitting: Family Medicine

## 2016-04-06 MED ORDER — BENZONATATE 100 MG PO CAPS: 100.0000 mg | ORAL_CAPSULE | Freq: Three times a day (TID) | ORAL | 0 refills | Status: DC | PRN

## 2016-04-06 NOTE — Telephone Encounter (Signed)
Patient notified

## 2016-04-06 NOTE — Telephone Encounter (Signed)
PT WAS SEEN Friday AND WAS NOT NEEDING COUGH MEDICATION AT THE TIME THE DR OFFERED IT BUT OVER THE WEEKEND SHE HAS NOT GOT A BAD COUGH AND IS REQUESTING A COUGH MEDICATION.PHARM IS [facility] [place].

## 2016-04-06 NOTE — Telephone Encounter (Signed)
done

## 2016-04-25 ENCOUNTER — Other Ambulatory Visit: Payer: Self-pay | Admitting: Family Medicine

## 2016-04-25 DIAGNOSIS — E038 Other specified hypothyroidism: Secondary | ICD-10-CM

## 2016-04-27 ENCOUNTER — Ambulatory Visit: Payer: Self-pay | Attending: Family Medicine

## 2016-05-26 ENCOUNTER — Encounter: Payer: Self-pay | Admitting: Family Medicine

## 2016-05-26 ENCOUNTER — Ambulatory Visit (INDEPENDENT_AMBULATORY_CARE_PROVIDER_SITE_OTHER): Payer: BLUE CROSS/BLUE SHIELD | Admitting: Family Medicine

## 2016-05-26 VITALS — BP 106/64 | HR 80 | Temp 97.9°F | Resp 18 | Ht 63.5 in | Wt 151.1 lb

## 2016-05-26 DIAGNOSIS — H9203 Otalgia, bilateral: Secondary | ICD-10-CM

## 2016-05-26 DIAGNOSIS — J209 Acute bronchitis, unspecified: Secondary | ICD-10-CM | POA: Diagnosis not present

## 2016-05-26 MED ORDER — AMOXICILLIN-POT CLAVULANATE 875-125 MG PO TABS: 1.0000 | ORAL_TABLET | Freq: Two times a day (BID) | ORAL | 0 refills | Status: DC

## 2016-05-26 NOTE — Progress Notes (Signed)
Name: Sandra Chandler   MRN: [id number]    DOB: [birth year]   Date:05/26/2016       Progress Note  Subjective  Chief Complaint  Chief Complaint  Patient presents with  . Nasal Congestion    Onset-4 days ago, coughing up yellow mucus, SOB, bilateral ear pressure and discomfort. Patient has tried Mucinex and NyQuil.     HPI  Bronchitis: she states that she took Tri-pack in January but took a long time to feel better, but finally symptoms resolved. However four days ago she developed a productive cough with yellow sputum, wheezing and SOB with activity, and ears feels hot, not very painful, but uncomfortable. Appetite is normal, no fever or chills, no nausea or vomiting or diarrhea. She has lost weight because she has been working out 5 days a week and changed her diet.   Patient Active Problem List   Diagnosis Date Noted  . Bipolar affective disorder, most recent episode unspecified type, remission status unspecified 03/20/2015  . Major depressive disorder, recurrent, in full remission with anxious distress ([place]) 11/29/2014  . Genital herpes 11/29/2014  . History of basal cell cancer 11/29/2014  . Adult hypothyroidism 11/29/2014  . Menopause 11/29/2014  . Patella-femoral syndrome 11/29/2014  . Overweight 11/29/2014  . History of basal cell carcinoma 01/10/2013  . Vitamin D deficiency 09/18/2009    Past Surgical History:  Procedure Laterality Date  . BILATERAL OOPHORECTOMY  05/2011  . BREAST SURGERY  1984  . CESAREAN SECTION     x2  . MOHS SURGERY  01/28/2013  . TUBAL LIGATION      Family History  Problem Relation Age of Onset  . Osteoporosis Mother   . Bipolar disorder Father   . Cancer Father     Lung  . Bipolar disorder Brother     Social History   Social History  . Marital status: Divorced    Spouse name: N/A  . Number of children: 2  . Years of education: N/A   Occupational History  . shipping and receiving      [employer]   Social History Main Topics  .  Smoking status: Never Smoker  . Smokeless tobacco: Never Used  . Alcohol use 0.0 oz/week     Comment: a little  . Drug use: No  . Sexual activity: No   Other Topics Concern  . Not on file   Social History Narrative   She lives alone. Divorced, has two grown children. Dating.      Current Outpatient Prescriptions:  .  clonazePAM (KLONOPIN) 0.5 MG tablet, Take 1 tablet (0.5 mg total) by mouth 2 (two) times daily as needed for anxiety. Or sleep, Disp: 30 tablet, Rfl: 0 .  levothyroxine (SYNTHROID, LEVOTHROID) 50 MCG tablet, Take 1 tablet (50 mcg total) by mouth daily., Disp: 90 tablet, Rfl: 1 .  valACYclovir (VALTREX) 500 MG tablet, Take 1 tablet (500 mg total) by mouth daily. And three times daily prn outbreak, Disp: 35 tablet, Rfl: 12 .  amoxicillin-clavulanate (AUGMENTIN) 875-125 MG tablet, Take 1 tablet by mouth 2 (two) times daily., Disp: 20 tablet, Rfl: 0  No Known Allergies   ROS  Ten systems reviewed and is negative except as mentioned in HPI She has been exercising daily and has lost 10 lbs since last visit   Objective  Vitals:   05/26/16 1602  BP: 106/64  Pulse: 80  Resp: 18  Temp: 97.9 F (36.6 C)  TempSrc: Oral  SpO2: 96%  Weight: 151 lb  2 oz (68.5 kg)  Height: 5' 3.5" (1.613 m)    Body mass index is 26.35 kg/m.  Physical Exam  Constitutional: Patient appears well-developed and well-nourished.No distress.  HEENT: head atraumatic, normocephalic, pupils equal and reactive to light, ears normal TM bilaterally,  neck supple, throat within normal limits Cardiovascular: Normal rate, regular rhythm and normal heart sounds.  No murmur heard. No BLE edema. Pulmonary/Chest: Effort normal and breath sounds normal. No respiratory distress. Abdominal: Soft.  There is no tenderness. Psychiatric: Patient has a normal mood and affect. behavior is normal. Judgment and thought content normal.  PHQ2/9: Depression screen [facility] 2/9 04/03/2016 12/03/2015 03/20/2015 11/29/2014   Decreased Interest 0 0 0 0  Down, Depressed, Hopeless 0 0 0 0  PHQ - 2 Score 0 0 0 0    Fall Risk: Fall Risk  04/03/2016 12/03/2015 03/20/2015 11/29/2014  Falls in the past year? No No No No     Assessment & Plan  1. Bronchitis with bronchospasm  Advised her to call back for CXR if no improvement of symptoms with medication. She will also resume Memory Dance that was given to her back in January  - amoxicillin-clavulanate (AUGMENTIN) 875-125 MG tablet; Take 1 tablet by mouth 2 (two) times daily.  Dispense: 20 tablet; Refill: 0  2. Otalgia of both ears  Normal exam, reassurance for now

## 2016-06-01 ENCOUNTER — Ambulatory Visit: Payer: BLUE CROSS/BLUE SHIELD | Admitting: Family Medicine

## 2016-06-16 ENCOUNTER — Other Ambulatory Visit: Payer: Self-pay | Admitting: Family Medicine

## 2016-06-16 ENCOUNTER — Ambulatory Visit: Payer: Self-pay

## 2016-06-16 ENCOUNTER — Ambulatory Visit
Admission: RE | Admit: 2016-06-16 | Discharge: 2016-06-16 | Disposition: A | Discharge status: Home / Self Care | Payer: BLUE CROSS/BLUE SHIELD | Source: Ambulatory Visit | Attending: Family Medicine | Admitting: Family Medicine

## 2016-06-16 DIAGNOSIS — Z1231 Encounter for screening mammogram for malignant neoplasm of breast: Secondary | ICD-10-CM | POA: Diagnosis not present

## 2016-06-16 DIAGNOSIS — R928 Other abnormal and inconclusive findings on diagnostic imaging of breast: Secondary | ICD-10-CM | POA: Diagnosis not present

## 2016-06-16 DIAGNOSIS — Z1239 Encounter for other screening for malignant neoplasm of breast: Secondary | ICD-10-CM

## 2016-06-17 ENCOUNTER — Other Ambulatory Visit: Payer: Self-pay | Admitting: Family Medicine

## 2016-06-17 DIAGNOSIS — R928 Other abnormal and inconclusive findings on diagnostic imaging of breast: Secondary | ICD-10-CM

## 2016-06-17 DIAGNOSIS — N6310 Unspecified lump in the right breast, unspecified quadrant: Secondary | ICD-10-CM

## 2016-06-19 ENCOUNTER — Telehealth: Payer: Self-pay

## 2016-06-19 NOTE — Telephone Encounter (Signed)
[place] informed patient would have to get results from her doctor, please review and let me know what to tell her. Thanks

## 2016-06-23 ENCOUNTER — Ambulatory Visit
Admission: RE | Admit: 2016-06-23 | Discharge: 2016-06-23 | Disposition: A | Discharge status: Home / Self Care | Payer: BLUE CROSS/BLUE SHIELD | Source: Ambulatory Visit | Attending: Family Medicine | Admitting: Family Medicine

## 2016-06-23 DIAGNOSIS — N6489 Other specified disorders of breast: Secondary | ICD-10-CM | POA: Diagnosis not present

## 2016-06-23 DIAGNOSIS — Z9882 Breast implant status: Secondary | ICD-10-CM | POA: Diagnosis not present

## 2016-06-23 DIAGNOSIS — N6310 Unspecified lump in the right breast, unspecified quadrant: Secondary | ICD-10-CM | POA: Insufficient documentation

## 2016-06-23 DIAGNOSIS — R928 Other abnormal and inconclusive findings on diagnostic imaging of breast: Secondary | ICD-10-CM

## 2016-06-23 DIAGNOSIS — R922 Inconclusive mammogram: Secondary | ICD-10-CM | POA: Diagnosis not present

## 2016-08-22 ENCOUNTER — Other Ambulatory Visit: Payer: Self-pay | Admitting: Family Medicine

## 2016-08-22 DIAGNOSIS — F3342 Major depressive disorder, recurrent, in full remission: Secondary | ICD-10-CM

## 2016-08-23 NOTE — Telephone Encounter (Signed)
Last visit was 3 months ago [facility] site reviewed; no early fills; no other prescribers Patient has upcoming appt with primary on 09/08/16 Limited refill approved, [facility]

## 2016-09-08 ENCOUNTER — Ambulatory Visit: Payer: BLUE CROSS/BLUE SHIELD | Admitting: Family Medicine

## 2016-09-15 DIAGNOSIS — L570 Actinic keratosis: Secondary | ICD-10-CM | POA: Diagnosis not present

## 2016-09-15 DIAGNOSIS — Z85828 Personal history of other malignant neoplasm of skin: Secondary | ICD-10-CM | POA: Diagnosis not present

## 2016-09-15 DIAGNOSIS — L578 Other skin changes due to chronic exposure to nonionizing radiation: Secondary | ICD-10-CM | POA: Diagnosis not present

## 2016-09-28 ENCOUNTER — Ambulatory Visit: Payer: BLUE CROSS/BLUE SHIELD | Admitting: Family Medicine

## 2016-11-06 DIAGNOSIS — H5203 Hypermetropia, bilateral: Secondary | ICD-10-CM | POA: Diagnosis not present

## 2016-11-06 DIAGNOSIS — H524 Presbyopia: Secondary | ICD-10-CM | POA: Diagnosis not present

## 2016-11-06 DIAGNOSIS — H52203 Unspecified astigmatism, bilateral: Secondary | ICD-10-CM | POA: Diagnosis not present

## 2016-11-19 ENCOUNTER — Other Ambulatory Visit: Payer: Self-pay | Admitting: Family Medicine

## 2016-11-19 DIAGNOSIS — E038 Other specified hypothyroidism: Secondary | ICD-10-CM

## 2016-11-19 NOTE — Telephone Encounter (Signed)
[facility]  

## 2016-11-20 NOTE — Telephone Encounter (Signed)
She needs to be seen.

## 2016-11-20 NOTE — Telephone Encounter (Signed)
Pt scheduled  

## 2016-11-25 ENCOUNTER — Encounter: Payer: Self-pay | Admitting: Family Medicine

## 2016-11-25 ENCOUNTER — Ambulatory Visit (INDEPENDENT_AMBULATORY_CARE_PROVIDER_SITE_OTHER): Payer: BLUE CROSS/BLUE SHIELD | Admitting: Family Medicine

## 2016-11-25 VITALS — BP 108/66 | HR 66 | Temp 97.8°F | Resp 16 | Ht 64.0 in | Wt 158.3 lb

## 2016-11-25 DIAGNOSIS — G4709 Other insomnia: Secondary | ICD-10-CM | POA: Diagnosis not present

## 2016-11-25 DIAGNOSIS — B001 Herpesviral vesicular dermatitis: Secondary | ICD-10-CM | POA: Diagnosis not present

## 2016-11-25 DIAGNOSIS — F3342 Major depressive disorder, recurrent, in full remission: Secondary | ICD-10-CM | POA: Diagnosis not present

## 2016-11-25 DIAGNOSIS — A6000 Herpesviral infection of urogenital system, unspecified: Secondary | ICD-10-CM | POA: Diagnosis not present

## 2016-11-25 DIAGNOSIS — Z131 Encounter for screening for diabetes mellitus: Secondary | ICD-10-CM

## 2016-11-25 DIAGNOSIS — E559 Vitamin D deficiency, unspecified: Secondary | ICD-10-CM | POA: Diagnosis not present

## 2016-11-25 DIAGNOSIS — Z23 Encounter for immunization: Secondary | ICD-10-CM

## 2016-11-25 DIAGNOSIS — E039 Hypothyroidism, unspecified: Secondary | ICD-10-CM | POA: Diagnosis not present

## 2016-11-25 DIAGNOSIS — E785 Hyperlipidemia, unspecified: Secondary | ICD-10-CM

## 2016-11-25 MED ORDER — LEVOTHYROXINE SODIUM 50 MCG PO TABS: 50.0000 ug | ORAL_TABLET | Freq: Every day | ORAL | 1 refills | Status: DC

## 2016-11-25 MED ORDER — TEMAZEPAM 15 MG PO CAPS: 15.0000 mg | ORAL_CAPSULE | Freq: Every evening | ORAL | 0 refills | Status: DC | PRN

## 2016-11-25 MED ORDER — VALACYCLOVIR HCL 500 MG PO TABS: 500.0000 mg | ORAL_TABLET | Freq: Every day | ORAL | 2 refills | Status: DC

## 2016-11-25 MED ORDER — VALACYCLOVIR HCL 500 MG PO TABS
500.0000 mg | ORAL_TABLET | Freq: Every day | ORAL | 2 refills | Status: DC
Start: 2016-11-25 — End: 2016-11-25

## 2016-11-25 NOTE — Addendum Note (Signed)
Addended by: Lolita Rieger D on: 11/25/2016 10:26 AM   Modules accepted: Orders

## 2016-11-25 NOTE — Progress Notes (Signed)
Name: Sandra Chandler   MRN: [id number]    DOB: 29-Jun-1960   Date:11/25/2016       Progress Note  Subjective  Chief Complaint  Chief Complaint  Patient presents with  . Medication Refill  . Hypothyroidism    HPI  Hypothyroidism: she ran out of medication 2 weeks ago, past two TSH's within normal limits, no constipation , mild weight gain, hair is breaking off, always has dry skin. [place] of [place] the [place].   Major Depression/Bipolar: doing well, no recent episodes of mania, not feeling depressed, she states as she ages she is getting better. She is able to re-direct her thoughts. She denies outburst of anger. No suicidal thoughts or ideation. She is practicing mindfulness.   History of basal cell with a lesion on lower lip: she is keeping follow up with Dermatologist - Dr. Phillip Heal.   Genital Herpes: no recent outbreaks, and not sexually active for years. She takes medication prn only. She is dating now , but he has ED.  Insomnia: she takes Clonopin occasionally for sleep, but we will try Temazepam and see if works better. She has been sleeping well at night, gets up very early to exercise and is falling asleep by 9, very seldom needs help to sleep   Patient Active Problem List   Diagnosis Date Noted  . Bipolar affective disorder, most recent episode unspecified type, remission status unspecified 03/20/2015  . Major depressive disorder, recurrent, in full remission with anxious distress ([place]) 11/29/2014  . Genital herpes 11/29/2014  . History of basal cell cancer 11/29/2014  . Adult hypothyroidism 11/29/2014  . Menopause 11/29/2014  . Patella-femoral syndrome 11/29/2014  . Overweight 11/29/2014  . History of basal cell carcinoma 01/10/2013  . Vitamin D deficiency 09/18/2009    Past Surgical History:  Procedure Laterality Date  . AUGMENTATION MAMMAPLASTY Bilateral    age 56 redone @ 5 years ago  . BILATERAL OOPHORECTOMY  05/2011  . BREAST SURGERY  1984   . CESAREAN SECTION     x2  . MOHS SURGERY  01/28/2013  . TUBAL LIGATION      Family History  Problem Relation Age of Onset  . Osteoporosis Mother   . Bipolar disorder Father   . Cancer Father        Lung  . Bipolar disorder Brother   . Breast cancer Maternal Grandmother 60    Social History   Social History  . Marital status: Divorced    Spouse name: N/A  . Number of children: 2  . Years of education: N/A   Occupational History  . shipping and receiving      [employer]   Social History Main Topics  . Smoking status: Never Smoker  . Smokeless tobacco: Never Used  . Alcohol use 0.0 oz/week     Comment: a little  . Drug use: No  . Sexual activity: No   Other Topics Concern  . Not on file   Social History Narrative   She lives alone. Divorced, has two grown children. Dating.      Current Outpatient Prescriptions:  .  levothyroxine (SYNTHROID, LEVOTHROID) 50 MCG tablet, Take 1 tablet (50 mcg total) by mouth daily., Disp: 30 tablet, Rfl: 1 .  temazepam (RESTORIL) 15 MG capsule, Take 1 capsule (15 mg total) by mouth at bedtime as needed for sleep., Disp: 30 capsule, Rfl: 0 .  valACYclovir (VALTREX) 500 MG tablet, Take 1 tablet (500 mg total) by mouth daily. And  three times daily prn outbreak, Disp: 35 tablet, Rfl: 2  No Known Allergies   ROS  Constitutional: Negative for fever , mild weight change.  Respiratory: Negative for cough and shortness of breath.   Cardiovascular: Negative for chest pain or palpitations.  Gastrointestinal: Negative for abdominal pain, no bowel changes.  Musculoskeletal: Negative for gait problem or joint swelling.  Skin: Negative for rash.  Neurological: Negative for dizziness or headache.  No other specific complaints in a complete review of systems (except as listed in HPI above).  Objective  Vitals:   11/25/16 0803  BP: 108/66  Pulse: 66  Resp: 16  Temp: 97.8 F (36.6 C)  TempSrc: Oral  SpO2: 98%  Weight: 158 lb 4.8 oz  (71.8 kg)  Height: 5\' 4"  (1.626 m)    Body mass index is 27.17 kg/m.  Physical Exam  Constitutional: Patient appears well-developed and well-nourished. Overweight. No distress.  HEENT: head atraumatic, normocephalic, pupils equal and reactive to light, , neck supple, throat within normal limits Cardiovascular: Normal rate, regular rhythm and normal heart sounds.  No murmur heard. No BLE edema. Pulmonary/Chest: Effort normal and breath sounds normal. No respiratory distress. Abdominal: Soft.  There is no tenderness. Psychiatric: Patient has a normal mood and affect. behavior is normal. Judgment and thought content normal.  PHQ2/9: Depression screen [facility] 2/9 11/25/2016 04/03/2016 12/03/2015 03/20/2015 11/29/2014  Decreased Interest 0 0 0 0 0  Down, Depressed, Hopeless 0 0 0 0 0  PHQ - 2 Score 0 0 0 0 0     Fall Risk: Fall Risk  11/25/2016 04/03/2016 12/03/2015 03/20/2015 11/29/2014  Falls in the past year? No No No No No    Functional Status Survey: Is the patient deaf or have difficulty hearing?: No Does the patient have difficulty seeing, even when wearing glasses/contacts?: Yes (glasses and contacts) Does the patient have difficulty concentrating, remembering, or making decisions?: No Does the patient have difficulty walking or climbing stairs?: No Does the patient have difficulty dressing or bathing?: No Does the patient have difficulty doing errands alone such as visiting a doctor's office or shopping?: No   Assessment & Plan  1. Adult hypothyroidism  - levothyroxine (SYNTHROID, LEVOTHROID) 50 MCG tablet; Take 1 tablet (50 mcg total) by mouth daily.  Dispense: 30 tablet; Refill: 1 - TSH  2. Dyslipidemia  - Lipid panel  3. Major depressive disorder, recurrent, in full remission with anxious distress ([facility])  - COMPLETE METABOLIC PANEL WITH GFR  4. Other insomnia  - temazepam (RESTORIL) 15 MG capsule; Take 1 capsule (15 mg total) by mouth at bedtime as needed for sleep.   Dispense: 30 capsule; Refill: 0  5. Vitamin D deficiency  Take otc vitamin D   6. Herpes simplex infection of genitourinary system  - valACYclovir (VALTREX) 500 MG tablet; Take 1 tablet (500 mg total) by mouth daily. And three times daily prn outbreak  Dispense: 35 tablet; Refill: 2  7. Fever blister  - valACYclovir (VALTREX) 500 MG tablet; Take 1 tablet (500 mg total) by mouth daily. And three times daily prn outbreak  Dispense: 35 tablet; Refill: 2  8. Screening for diabetes mellitus  - Hemoglobin A1c  9. Needs flu shot  - Flu Vaccine QUAD 36+ mos IM

## 2017-01-30 DIAGNOSIS — Z23 Encounter for immunization: Secondary | ICD-10-CM | POA: Diagnosis not present

## 2017-02-01 DIAGNOSIS — E785 Hyperlipidemia, unspecified: Secondary | ICD-10-CM | POA: Diagnosis not present

## 2017-02-01 DIAGNOSIS — F3342 Major depressive disorder, recurrent, in full remission: Secondary | ICD-10-CM | POA: Diagnosis not present

## 2017-02-01 DIAGNOSIS — E039 Hypothyroidism, unspecified: Secondary | ICD-10-CM | POA: Diagnosis not present

## 2017-02-01 DIAGNOSIS — L578 Other skin changes due to chronic exposure to nonionizing radiation: Secondary | ICD-10-CM | POA: Diagnosis not present

## 2017-02-01 DIAGNOSIS — Z131 Encounter for screening for diabetes mellitus: Secondary | ICD-10-CM | POA: Diagnosis not present

## 2017-02-02 LAB — COMPLETE METABOLIC PANEL WITH GFR
AG Ratio: 1.6 (calc) (ref 1.0–2.5)
ALBUMIN MSPROF: 4.6 g/dL (ref 3.6–5.1)
ALKALINE PHOSPHATASE (APISO): 48 U/L (ref 33–130)
ALT: 21 U/L (ref 6–29)
AST: 24 U/L (ref 10–35)
BUN: 12 mg/dL (ref 7–25)
CALCIUM: 9.9 mg/dL (ref 8.6–10.4)
CO2: 29 mmol/L (ref 20–32)
CREATININE: 0.73 mg/dL (ref 0.50–1.05)
Chloride: 104 mmol/L (ref 98–110)
GFR, Est African American: 108 mL/min/{1.73_m2} (ref >=60)
GFR, Est Non African American: 93 mL/min/{1.73_m2} (ref >=60)
GLUCOSE: 86 mg/dL (ref 65–99)
Globulin: 2.8 g/dL (calc) (ref 1.9–3.7)
Potassium: 4.3 mmol/L (ref 3.5–5.3)
Sodium: 140 mmol/L (ref 135–146)
Total Bilirubin: 0.4 mg/dL (ref 0.2–1.2)
Total Protein: 7.4 g/dL (ref 6.1–8.1)

## 2017-02-02 LAB — LIPID PANEL
CHOL/HDL RATIO: 2.9 (calc) (ref <5.0)
CHOLESTEROL: 235 mg/dL — AB (ref <200)
HDL: 81 mg/dL (ref >50)
LDL CHOLESTEROL (CALC): 138 mg/dL — AB
Non-HDL Cholesterol (Calc): 154 mg/dL (calc) — ABNORMAL HIGH (ref <130)
Triglycerides: 72 mg/dL (ref <150)

## 2017-02-02 LAB — HEMOGLOBIN A1C
HEMOGLOBIN A1C: 5.3 %{Hb} (ref <5.7)
Mean Plasma Glucose: 105 (calc)
eAG (mmol/L): 5.8 (calc)

## 2017-02-02 LAB — TSH: TSH: 1.29 m[IU]/L

## 2017-02-04 ENCOUNTER — Telehealth: Payer: Self-pay | Admitting: Family Medicine

## 2017-02-04 NOTE — Telephone Encounter (Signed)
Patient called.  Patient aware.  

## 2017-02-04 NOTE — Telephone Encounter (Signed)
Copied from [place] [id number]. Topic: Quick Communication - See Telephone Encounter >> Feb 04, 2017  8:14 AM Burnis Medin, NT wrote: CRM for notification. See Telephone encounter for: Pt. Is calling in about getting results from her thyroid test. Pt. Would like a call back.  02/04/17.

## 2017-02-15 ENCOUNTER — Other Ambulatory Visit: Payer: Self-pay

## 2017-02-15 DIAGNOSIS — E039 Hypothyroidism, unspecified: Secondary | ICD-10-CM

## 2017-02-15 MED ORDER — LEVOTHYROXINE SODIUM 50 MCG PO TABS: 50.0000 ug | ORAL_TABLET | Freq: Every day | ORAL | 2 refills | Status: DC

## 2017-02-15 NOTE — Telephone Encounter (Signed)
Refill request for thyroid medication: 11/25/2016  Lab Results  Component Value Date   TSH 1.29 02/01/2017     Next appointment is on 05/27/2017.

## 2017-03-04 DIAGNOSIS — L570 Actinic keratosis: Secondary | ICD-10-CM | POA: Diagnosis not present

## 2017-04-21 ENCOUNTER — Other Ambulatory Visit: Payer: Self-pay | Admitting: Family Medicine

## 2017-04-21 ENCOUNTER — Telehealth: Payer: Self-pay

## 2017-04-21 DIAGNOSIS — E039 Hypothyroidism, unspecified: Secondary | ICD-10-CM

## 2017-04-21 NOTE — Telephone Encounter (Signed)
Yes

## 2017-04-21 NOTE — Telephone Encounter (Signed)
Copied from [place] [id number]. Topic: Referral - Request >> Apr 21, 2017 10:48 AM Sandra Chandler wrote: Reason for CRM: pt called in to request a referral to Dr. Eddie Dibbles a Endocrinologist. Pt says if provider is not available she would be okay with seeing any endocrinologist.   CB: CB: [phone]

## 2017-05-10 ENCOUNTER — Encounter: Payer: Self-pay | Admitting: Family Medicine

## 2017-05-27 ENCOUNTER — Encounter: Payer: Self-pay | Admitting: Family Medicine

## 2017-05-27 ENCOUNTER — Ambulatory Visit (INDEPENDENT_AMBULATORY_CARE_PROVIDER_SITE_OTHER): Payer: BLUE CROSS/BLUE SHIELD | Admitting: Family Medicine

## 2017-05-27 VITALS — BP 120/70 | HR 99 | Temp 98.0°F | Resp 14 | Ht 63.39 in | Wt 161.4 lb

## 2017-05-27 DIAGNOSIS — A6000 Herpesviral infection of urogenital system, unspecified: Secondary | ICD-10-CM

## 2017-05-27 DIAGNOSIS — Z124 Encounter for screening for malignant neoplasm of cervix: Secondary | ICD-10-CM

## 2017-05-27 DIAGNOSIS — Z23 Encounter for immunization: Secondary | ICD-10-CM

## 2017-05-27 DIAGNOSIS — M2560 Stiffness of unspecified joint, not elsewhere classified: Secondary | ICD-10-CM

## 2017-05-27 DIAGNOSIS — Z114 Encounter for screening for human immunodeficiency virus [HIV]: Secondary | ICD-10-CM | POA: Diagnosis not present

## 2017-05-27 DIAGNOSIS — E039 Hypothyroidism, unspecified: Secondary | ICD-10-CM

## 2017-05-27 DIAGNOSIS — Z1211 Encounter for screening for malignant neoplasm of colon: Secondary | ICD-10-CM

## 2017-05-27 DIAGNOSIS — M25511 Pain in right shoulder: Secondary | ICD-10-CM | POA: Diagnosis not present

## 2017-05-27 DIAGNOSIS — G8929 Other chronic pain: Secondary | ICD-10-CM

## 2017-05-27 DIAGNOSIS — Z113 Encounter for screening for infections with a predominantly sexual mode of transmission: Secondary | ICD-10-CM | POA: Diagnosis not present

## 2017-05-27 DIAGNOSIS — B001 Herpesviral vesicular dermatitis: Secondary | ICD-10-CM | POA: Diagnosis not present

## 2017-05-27 DIAGNOSIS — G4709 Other insomnia: Secondary | ICD-10-CM | POA: Diagnosis not present

## 2017-05-27 DIAGNOSIS — M2240 Chondromalacia patellae, unspecified knee: Secondary | ICD-10-CM | POA: Insufficient documentation

## 2017-05-27 DIAGNOSIS — Z01419 Encounter for gynecological examination (general) (routine) without abnormal findings: Secondary | ICD-10-CM

## 2017-05-27 MED ORDER — LEVOTHYROXINE SODIUM 50 MCG PO TABS: 50.0000 ug | ORAL_TABLET | Freq: Every day | ORAL | 1 refills | Status: DC

## 2017-05-27 MED ORDER — TEMAZEPAM 15 MG PO CAPS: 15.0000 mg | ORAL_CAPSULE | Freq: Every evening | ORAL | 0 refills | Status: DC | PRN

## 2017-05-27 NOTE — Addendum Note (Signed)
Addended by: Donalee Citrin on: 05/27/2017 04:23 PM   Modules accepted: Orders

## 2017-05-27 NOTE — Patient Instructions (Signed)
Shoulder Exercises Ask your health care provider which exercises are safe for you. Do exercises exactly as told by your health care provider and adjust them as directed. It is normal to feel mild stretching, pulling, tightness, or discomfort as you do these exercises, but you should stop right away if you feel sudden pain or your pain gets worse.Do not begin these exercises until told by your health care provider. RANGE OF MOTION EXERCISES These exercises warm up your muscles and joints and improve the movement and flexibility of your shoulder. These exercises also help to relieve pain, numbness, and tingling. These exercises involve stretching your injured shoulder directly. Exercise A: Pendulum  1. Stand near a wall or a surface that you can hold onto for balance. 2. Bend at the waist and let your left / right arm hang straight down. Use your other arm to support you. Keep your back straight and do not lock your knees. 3. Relax your left / right arm and shoulder muscles, and move your hips and your trunk so your left / right arm swings freely. Your arm should swing because of the motion of your body, not because you are using your arm or shoulder muscles. 4. Keep moving your body so your arm swings in the following directions, as told by your health care provider: ? Side to side. ? Forward and backward. ? In clockwise and counterclockwise circles. 5. Continue each motion for __________ seconds, or for as long as told by your health care provider. 6. Slowly return to the starting position. Repeat __________ times. Complete this exercise __________ times a day. Exercise B:Flexion, Standing  1. Stand and hold a broomstick, a cane, or a similar object. Place your hands a little more than shoulder-width apart on the object. Your left / right hand should be palm-up, and your other hand should be palm-down. 2. Keep your elbow straight and keep your shoulder muscles relaxed. Push the stick down with  your healthy arm to raise your left / right arm in front of your body, and then over your head until you feel a stretch in your shoulder. ? Avoid shrugging your shoulder while you raise your arm. Keep your shoulder blade tucked down toward the middle of your back. 3. Hold for __________ seconds. 4. Slowly return to the starting position. Repeat __________ times. Complete this exercise __________ times a day. Exercise C: Abduction, Standing 1. Stand and hold a broomstick, a cane, or a similar object. Place your hands a little more than shoulder-width apart on the object. Your left / right hand should be palm-up, and your other hand should be palm-down. 2. While keeping your elbow straight and your shoulder muscles relaxed, push the stick across your body toward your left / right side. Raise your left / right arm to the side of your body and then over your head until you feel a stretch in your shoulder. ? Do not raise your arm above shoulder height, unless your health care provider tells you to do that. ? Avoid shrugging your shoulder while you raise your arm. Keep your shoulder blade tucked down toward the middle of your back. 3. Hold for __________ seconds. 4. Slowly return to the starting position. Repeat __________ times. Complete this exercise __________ times a day. Exercise D:Internal Rotation  1. Place your left / right hand behind your back, palm-up. 2. Use your other hand to dangle an exercise band, a towel, or a similar object over your shoulder. Grasp the band with   your left / right hand so you are holding onto both ends. 3. Gently pull up on the band until you feel a stretch in the front of your left / right shoulder. ? Avoid shrugging your shoulder while you raise your arm. Keep your shoulder blade tucked down toward the middle of your back. 4. Hold for __________ seconds. 5. Release the stretch by letting go of the band and lowering your hands. Repeat __________ times. Complete  this exercise __________ times a day. STRETCHING EXERCISES These exercises warm up your muscles and joints and improve the movement and flexibility of your shoulder. These exercises also help to relieve pain, numbness, and tingling. These exercises are done using your healthy shoulder to help stretch the muscles of your injured shoulder. Exercise E: Corner Stretch (External Rotation and Abduction)  1. Stand in a doorway with one of your feet slightly in front of the other. This is called a staggered stance. If you cannot reach your forearms to the door frame, stand facing a corner of a room. 2. Choose one of the following positions as told by your health care provider: ? Place your hands and forearms on the door frame above your head. ? Place your hands and forearms on the door frame at the height of your head. ? Place your hands on the door frame at the height of your elbows. 3. Slowly move your weight onto your front foot until you feel a stretch across your chest and in the front of your shoulders. Keep your head and chest upright and keep your abdominal muscles tight. 4. Hold for __________ seconds. 5. To release the stretch, shift your weight to your back foot. Repeat __________ times. Complete this stretch __________ times a day. Exercise F:Extension, Standing 1. Stand and hold a broomstick, a cane, or a similar object behind your back. ? Your hands should be a little wider than shoulder-width apart. ? Your palms should face away from your back. 2. Keeping your elbows straight and keeping your shoulder muscles relaxed, move the stick away from your body until you feel a stretch in your shoulder. ? Avoid shrugging your shoulders while you move the stick. Keep your shoulder blade tucked down toward the middle of your back. 3. Hold for __________ seconds. 4. Slowly return to the starting position. Repeat __________ times. Complete this exercise __________ times a day. STRENGTHENING  EXERCISES These exercises build strength and endurance in your shoulder. Endurance is the ability to use your muscles for a long time, even after they get tired. Exercise G:External Rotation  1. Sit in a stable chair without armrests. 2. Secure an exercise band at elbow height on your left / right side. 3. Place a soft object, such as a folded towel or a small pillow, between your left / right upper arm and your body to move your elbow a few inches away (about 10 cm) from your side. 4. Hold the end of the band so it is tight and there is no slack. 5. Keeping your elbow pressed against the soft object, move your left / right forearm out, away from your abdomen. Keep your body steady so only your forearm moves. 6. Hold for __________ seconds. 7. Slowly return to the starting position. Repeat __________ times. Complete this exercise __________ times a day. Exercise H:Shoulder Abduction  1. Sit in a stable chair without armrests, or stand. 2. Hold a __________ weight in your left / right hand, or hold an exercise band with both hands.   3. Start with your arms straight down and your left / right palm facing in, toward your body. 4. Slowly lift your left / right hand out to your side. Do not lift your hand above shoulder height unless your health care provider tells you that this is safe. ? Keep your arms straight. ? Avoid shrugging your shoulder while you do this movement. Keep your shoulder blade tucked down toward the middle of your back. 5. Hold for __________ seconds. 6. Slowly lower your arm, and return to the starting position. Repeat __________ times. Complete this exercise __________ times a day. Exercise I:Shoulder Extension 1. Sit in a stable chair without armrests, or stand. 2. Secure an exercise band to a stable object in front of you where it is at shoulder height. 3. Hold one end of the exercise band in each hand. Your palms should face each other. 4. Straighten your elbows and  lift your hands up to shoulder height. 5. Step back, away from the secured end of the exercise band, until the band is tight and there is no slack. 6. Squeeze your shoulder blades together as you pull your hands down to the sides of your thighs. Stop when your hands are straight down by your sides. Do not let your hands go behind your body. 7. Hold for __________ seconds. 8. Slowly return to the starting position. Repeat __________ times. Complete this exercise __________ times a day. Exercise J:Standing Shoulder Row 1. Sit in a stable chair without armrests, or stand. 2. Secure an exercise band to a stable object in front of you so it is at waist height. 3. Hold one end of the exercise band in each hand. Your palms should be in a thumbs-up position. 4. Bend each of your elbows to an "L" shape (about 90 degrees) and keep your upper arms at your sides. 5. Step back until the band is tight and there is no slack. 6. Slowly pull your elbows back behind you. 7. Hold for __________ seconds. 8. Slowly return to the starting position. Repeat __________ times. Complete this exercise __________ times a day. Exercise K:Shoulder Press-Ups  1. Sit in a stable chair that has armrests. Sit upright, with your feet flat on the floor. 2. Put your hands on the armrests so your elbows are bent and your fingers are pointing forward. Your hands should be about even with the sides of your body. 3. Push down on the armrests and use your arms to lift yourself off of the chair. Straighten your elbows and lift yourself up as much as you comfortably can. ? Move your shoulder blades down, and avoid letting your shoulders move up toward your ears. ? Keep your feet on the ground. As you get stronger, your feet should support less of your body weight as you lift yourself up. 4. Hold for __________ seconds. 5. Slowly lower yourself back into the chair. Repeat __________ times. Complete this exercise __________ times a  day. Exercise L: Wall Push-Ups  1. Stand so you are facing a stable wall. Your feet should be about one arm-length away from the wall. 2. Lean forward and place your palms on the wall at shoulder height. 3. Keep your feet flat on the floor as you bend your elbows and lean forward toward the wall. 4. Hold for __________ seconds. 5. Straighten your elbows to push yourself back to the starting position. Repeat __________ times. Complete this exercise __________ times a day. This information is not intended to replace advice   given to you by your health care provider. Make sure you discuss any questions you have with your health care provider. Document Released: 01/28/2005 Document Revised: 12/09/2015 Document Reviewed: 11/25/2014 Elsevier Interactive Patient Education  2018 [place] Years, Female Preventive care refers to lifestyle choices and visits with your health care provider that can promote health and wellness. What does preventive care include?  A yearly physical exam. This is also called an annual well check.  Dental exams once or twice a year.  Routine eye exams. Ask your health care provider how often you should have your eyes checked.  Personal lifestyle choices, including: ? Daily care of your teeth and gums. ? Regular physical activity. ? Eating a healthy diet. ? Avoiding tobacco and drug use. ? Limiting alcohol use. ? Practicing safe sex. ? Taking low-dose aspirin daily starting at age 49. ? Taking vitamin and mineral supplements as recommended by your health care provider. What happens during an annual well check? The services and screenings done by your health care provider during your annual well check will depend on your age, overall health, lifestyle risk factors, and family history of disease. Counseling Your health care provider may ask you questions about your:  Alcohol use.  Tobacco use.  Drug use.  Emotional  well-being.  Home and relationship well-being.  Sexual activity.  Eating habits.  Work and work Statistician.  Method of birth control.  Menstrual cycle.  Pregnancy history.  Screening You may have the following tests or measurements:  Height, weight, and BMI.  Blood pressure.  Lipid and cholesterol levels. These may be checked every 5 years, or more frequently if you are over 62 years old.  Skin check.  Lung cancer screening. You may have this screening every year starting at age 51 if you have a 30-pack-year history of smoking and currently smoke or have quit within the past 15 years.  Fecal occult blood test (FOBT) of the stool. You may have this test every year starting at age 14.  Flexible sigmoidoscopy or colonoscopy. You may have a sigmoidoscopy every 5 years or a colonoscopy every 10 years starting at age 30.  Hepatitis C blood test.  Hepatitis B blood test.  Sexually transmitted disease (STD) testing.  Diabetes screening. This is done by checking your blood sugar (glucose) after you have not eaten for a while (fasting). You may have this done every 1-3 years.  Mammogram. This may be done every 1-2 years. Talk to your health care provider about when you should start having regular mammograms. This may depend on whether you have a family history of breast cancer.  BRCA-related cancer screening. This may be done if you have a family history of breast, ovarian, tubal, or peritoneal cancers.  Pelvic exam and Pap test. This may be done every 3 years starting at age 43. Starting at age 64, this may be done every 5 years if you have a Pap test in combination with an HPV test.  Bone density scan. This is done to screen for osteoporosis. You may have this scan if you are at high risk for osteoporosis.  Discuss your test results, treatment options, and if necessary, the need for more tests with your health care provider. Vaccines Your health care provider may recommend  certain vaccines, such as:  Influenza vaccine. This is recommended every year.  Tetanus, diphtheria, and acellular pertussis (Tdap, Td) vaccine. You may need a Td booster every 10 years.  Varicella vaccine. You may  need this if you have not been vaccinated.  Zoster vaccine. You may need this after age 32.  Measles, mumps, and rubella (MMR) vaccine. You may need at least one dose of MMR if you were born in 1957 or later. You may also need a second dose.  Pneumococcal 13-valent conjugate (PCV13) vaccine. You may need this if you have certain conditions and were not previously vaccinated.  Pneumococcal polysaccharide (PPSV23) vaccine. You may need one or two doses if you smoke cigarettes or if you have certain conditions.  Meningococcal vaccine. You may need this if you have certain conditions.  Hepatitis A vaccine. You may need this if you have certain conditions or if you travel or work in places where you may be exposed to hepatitis A.  Hepatitis B vaccine. You may need this if you have certain conditions or if you travel or work in places where you may be exposed to hepatitis B.  Haemophilus influenzae type b (Hib) vaccine. You may need this if you have certain conditions.  Talk to your health care provider about which screenings and vaccines you need and how often you need them. This information is not intended to replace advice given to you by your health care provider. Make sure you discuss any questions you have with your health care provider. Document Released: 04/12/2015 Document Revised: 12/04/2015 Document Reviewed: 01/15/2015 Elsevier Interactive Patient Education  [employer].

## 2017-05-27 NOTE — Progress Notes (Signed)
Name: Sandra Chandler   MRN: [id number]    DOB: 1960-10-26   Date:05/27/2017       Progress Note  Subjective  Chief Complaint  Chief Complaint  Patient presents with  . Annual Exam  . Medication Refill    6 month F/U  . Hypothyroidism  . Depression  . Insomnia  . Genital Herpes    HPI   Patient presents for annual CPE and follow up  Hypothyroidism: feeling well, weight is stable, denies dysphagia, stable hair loss, no constipation  Bipolar: doing well, no mood swings, denies any manic episodes, no crying spells.   Shoulder pain: injured right shoulder at work about 12 years ago, was doing well, however over the past few months she has noticed pain with internal rotation of shoulder, pain is dull, not affecting her sleep. She thinks it may have been aggravated by lifting weights.   Insomnia: she falls asleep but wakes up during the night and has difficulty falling back asleep. States Temazepam was not filled because was signed. We will send a refill today  Diet: healthy, avoiding red meat, eating fish, chicken, vegetables, needs to increase dairy in her diet Exercise: very active  Stiffness: she states she always has stiffness on lower extremity any time she sits and has to stand up again, she is very fit, but states the stiffness is getting progressively worse   USPSTF grade A and B recommendations  Depression:  Depression screen [facility] 2/9 05/27/2017 11/25/2016 04/03/2016 12/03/2015 03/20/2015  Decreased Interest 0 0 0 0 0  Down, Depressed, Hopeless 0 0 0 0 0  PHQ - 2 Score 0 0 0 0 0  Altered sleeping 2 - - - -  Tired, decreased energy 1 - - - -  Change in appetite 0 - - - -  Feeling bad or failure about yourself  0 - - - -  Trouble concentrating 0 - - - -  Moving slowly or fidgety/restless 0 - - - -  Suicidal thoughts 0 - - - -  PHQ-9 Score 3 - - - -  Difficult doing work/chores Not difficult at all - - - -   Hypertension: BP Readings from Last 3 Encounters:  05/27/17  120/70  11/25/16 108/66  05/26/16 106/64   Obesity: Wt Readings from Last 3 Encounters:  05/27/17 161 lb 6.4 oz (73.2 kg)  11/25/16 158 lb 4.8 oz (71.8 kg)  05/26/16 151 lb 2 oz (68.5 kg)   BMI Readings from Last 3 Encounters:  05/27/17 28.24 kg/m  11/25/16 27.17 kg/m  05/26/16 26.35 kg/m     HIV, hep B, hep C: willing to get HIV  STD testing and prevention (chl/gon/syphilis): we will check gonorrhea  Intimate partner violence:negative  Sexual History/Pain during Intercourse: not currently  Menstrual History/LMP/Abnormal Bleeding:post-menopausal  Incontinence Symptoms: none   Advanced Care Planning: A voluntary discussion about advance care planning including the explanation and discussion of advance directives.  Discussed health care proxy and Living will, and the patient was able to identify a health care proxy as mother Dyanne Carrel or daughter Janett Billow)  Patient does not have a living will at present time. If patient does have living will, I have requested they bring this to the clinic to be scanned in to their chart.  Breast cancer:  HM Mammogram  Date Value Ref Range Status  03/30/2014 Normal  Final    BRCA: grandmother had breast cancer also a cousin - she will think about it.  Cervical  cancer screening: today    Fall prevention/vitamin D: discussed importance of increasing calcium intake in her diet Lipids:  Lab Results  Component Value Date   CHOL 235 (H) 02/01/2017   CHOL 220 (H) 12/03/2015   CHOL 219 (H) 11/29/2014   Lab Results  Component Value Date   HDL 81 02/01/2017   HDL 75 12/03/2015   HDL 69 11/29/2014   Lab Results  Component Value Date   LDLCALC 130 (H) 12/03/2015   LDLCALC 134 (H) 11/29/2014   LDLCALC 115 07/27/2012   Lab Results  Component Value Date   TRIG 72 02/01/2017   TRIG 73 12/03/2015   TRIG 82 11/29/2014   Lab Results  Component Value Date   CHOLHDL 2.9 02/01/2017   CHOLHDL 2.9 12/03/2015   CHOLHDL 3.2 11/29/2014    No results found for: LDLDIRECT  Glucose:  Glucose  Date Value Ref Range Status  11/29/2014 86 65 - 99 mg/dL Final  06/03/2011 85 65 - 99 mg/dL Final   Glucose, Bld  Date Value Ref Range Status  02/01/2017 86 65 - 99 mg/dL Final    Comment:    .            Fasting reference interval .   12/03/2015 80 65 - 99 mg/dL Final    Skin cancer: sees dermatologist  Colorectal cancer:she will due cologuard  Lung cancer:   Low Dose CT Chest recommended if Age 69-80 years, 30 pack-year currently smoking OR have quit w/in 15years. Patient does not qualify.   Aspirin: advised to resume ECG: 2016   Patient Active Problem List   Diagnosis Date Noted  . Chondromalacia patellae 05/27/2017  . Bipolar affective disorder, most recent episode unspecified type, remission status unspecified 03/20/2015  . Major depressive disorder, recurrent, in full remission with anxious distress ([place]) 11/29/2014  . Genital herpes 11/29/2014  . Adult hypothyroidism 11/29/2014  . Menopause 11/29/2014  . Patella-femoral syndrome 11/29/2014  . Overweight 11/29/2014  . History of basal cell carcinoma 01/10/2013  . Vitamin D deficiency 09/18/2009    Past Surgical History:  Procedure Laterality Date  . AUGMENTATION MAMMAPLASTY Bilateral    age 47 redone @ 5 years ago  . BILATERAL OOPHORECTOMY  05/2011  . BREAST SURGERY  1984  . CESAREAN SECTION     x2  . MOHS SURGERY  01/28/2013  . TUBAL LIGATION      Family History  Problem Relation Age of Onset  . Osteoporosis Mother   . Bipolar disorder Father   . Cancer Father        Lung  . Bipolar disorder Brother   . Breast cancer Maternal Grandmother 38    Social History   Socioeconomic History  . Marital status: Divorced    Spouse name: Not on file  . Number of children: 2  . Years of education: Not on file  . Highest education level: 12th grade  Social Needs  . Financial resource strain: Not hard at all  . Food insecurity - worry: Never true  .  Food insecurity - inability: Never true  . Transportation needs - medical: No  . Transportation needs - non-medical: No  Occupational History  . Occupation: shipping and receiving     Comment: [employer]  Tobacco Use  . Smoking status: Never Smoker  . Smokeless tobacco: Never Used  Substance and Sexual Activity  . Alcohol use: Yes    Alcohol/week: 1.8 oz    Types: 2 Cans of beer, 1 Glasses of wine  per week  . Drug use: No  . Sexual activity: Not Currently    Partners: Male    Comment: dating, but boyfriend ED  Other Topics Concern  . Not on file  Social History Narrative   She lives alone, but sometimes stays with boyfriend.  Divorced, has two grown children.      Current Outpatient Medications:  .  aspirin EC 81 MG tablet, Take 1 tablet by mouth daily., Disp: , Rfl:  .  levothyroxine (SYNTHROID, LEVOTHROID) 50 MCG tablet, Take 1 tablet (50 mcg total) daily by mouth., Disp: 30 tablet, Rfl: 2 .  valACYclovir (VALTREX) 500 MG tablet, Take 1 tablet (500 mg total) by mouth daily. And three times daily prn outbreak, Disp: 35 tablet, Rfl: 2 .  temazepam (RESTORIL) 15 MG capsule, Take 1 capsule (15 mg total) by mouth at bedtime as needed for sleep. (Patient not taking: Reported on 05/27/2017), Disp: 30 capsule, Rfl: 0  No Known Allergies   ROS   Constitutional: Negative for fever or weight change.  Respiratory: Negative for cough and shortness of breath.   Cardiovascular: Negative for chest pain or palpitations.  Gastrointestinal: Negative for abdominal pain, no bowel changes.  Musculoskeletal: Negative for gait problem or joint swelling.  Skin: Negative for rash.  Neurological: Negative for dizziness or headache.  No other specific complaints in a complete review of systems (except as listed in HPI above).   Objective  Vitals:   05/27/17 0836  BP: 120/70  Pulse: 99  Resp: 14  Temp: 98 F (36.7 C)  TempSrc: Oral  SpO2: 98%  Weight: 161 lb 6.4 oz (73.2 kg)  Height: 5'  3.39" (1.61 m)    Body mass index is 28.24 kg/m.  Physical Exam  Constitutional: Patient appears well-developed and well-nourished. No distress.  HENT: Head: Normocephalic and atraumatic. Ears: B TMs ok, no erythema or effusion; Nose: Nose normal. Mouth/Throat: Oropharynx is clear and moist. No oropharyngeal exudate.  Eyes: Conjunctivae and EOM are normal. Pupils are equal, round, and reactive to light. No scleral icterus.  Neck: Normal range of motion. Neck supple. No JVD present. No thyromegaly present.  Cardiovascular: Normal rate, regular rhythm and normal heart sounds.  No murmur heard. No BLE edema. Pulmonary/Chest: Effort normal and breath sounds normal. No respiratory distress. Abdominal: Soft. Bowel sounds are normal, no distension. There is no tenderness. no masses Breast: no lumps or masses, no nipple discharge or rashes FEMALE GENITALIA:  External genitalia normal External urethra normal Vaginal atrophy  Cervix normal without discharge or lesions Bimanual exam normal without masses RECTAL: not done Musculoskeletal: decreased internal rotation of right shoulder, normal abduction and strength, no joint effusions. No gross deformities Neurological: he is alert and oriented to person, place, and time. No cranial nerve deficit. Coordination, balance, strength, speech and gait are normal.  Skin: Skin is warm and dry. No rash noted. No erythema.  Psychiatric: Patient has a normal mood and affect. behavior is normal. Judgment and thought content normal.     PHQ2/9: Depression screen [facility] 2/9 05/27/2017 11/25/2016 04/03/2016 12/03/2015 03/20/2015  Decreased Interest 0 0 0 0 0  Down, Depressed, Hopeless 0 0 0 0 0  PHQ - 2 Score 0 0 0 0 0  Altered sleeping 2 - - - -  Tired, decreased energy 1 - - - -  Change in appetite 0 - - - -  Feeling bad or failure about yourself  0 - - - -  Trouble concentrating 0 - - - -  Moving slowly or fidgety/restless 0 - - - -  Suicidal thoughts 0 - -  - -  PHQ-9 Score 3 - - - -  Difficult doing work/chores Not difficult at all - - - -     Fall Risk: Fall Risk  05/27/2017 11/25/2016 04/03/2016 12/03/2015 03/20/2015  Falls in the past year? _0      Functional Status Survey: Is the patient deaf or have difficulty hearing?: No Does the patient have difficulty seeing, even when wearing glasses/contacts?: No Does the patient have difficulty concentrating, remembering, or making decisions?: No Does the patient have difficulty walking or climbing stairs?: No Does the patient have difficulty dressing or bathing?: No Does the patient have difficulty doing errands alone such as visiting a doctor's office or shopping?: No   Assessment & Plan  1. Well woman exam  Discussed importance of 150 minutes of physical activity weekly, eat two servings of fish weekly, eat one serving of tree nuts ( cashews, pistachios, pecans, almonds.Marland Kitchen) every other day, eat 6 servings of fruit/vegetables daily and drink plenty of water and avoid sweet beverages.   2. Cervical cancer screening  - Pap IG, CT/NG NAA, and HPV (high risk)  3. Flu vaccine need  At work   4. Colon cancer screening  - Cologuard  5. Encounter for screening for HIV  - HIV antibody  6. Adult hypothyroidism  - TSH  7. Routine screening for STI (sexually transmitted infection)  - RPR - Hepatitis, Acute  8. Stiffness in joint  - ANA,IFA RA Diag Pnl w/rflx Tit/Patn - Sedimentation rate - C-reactive protein  9. Chronic right shoulder pain  Home exercises for now, she wants to hold off on PT   10. Other insomnia  - temazepam (RESTORIL) 15 MG capsule; Take 1 capsule (15 mg total) by mouth at bedtime as needed for sleep.  Dispense: 30 capsule; Refill: 0

## 2017-05-28 LAB — ANA,IFA RA DIAG PNL W/RFLX TIT/PATN
Anti Nuclear Antibody(ANA): NEGATIVE
Cyclic Citrullin Peptide Ab: <16 UNITS
Rhuematoid fact SerPl-aCnc: <14 IU/mL (ref <14)

## 2017-05-28 LAB — HEPATITIS PANEL, ACUTE
HEP A IGM: NONREACTIVE
Hep B C IgM: NONREACTIVE
Hepatitis B Surface Ag: NONREACTIVE
Hepatitis C Ab: NONREACTIVE
SIGNAL TO CUT-OFF: 0.06 (ref <1.00)

## 2017-05-28 LAB — HIV ANTIBODY (ROUTINE TESTING W REFLEX): HIV 1&2 Ab, 4th Generation: NONREACTIVE

## 2017-05-28 LAB — C-REACTIVE PROTEIN: CRP: 0.7 mg/L (ref <8.0)

## 2017-05-28 LAB — TSH: TSH: 0.59 mIU/L

## 2017-05-28 LAB — RPR: RPR: NONREACTIVE

## 2017-05-28 LAB — SEDIMENTATION RATE

## 2017-06-01 LAB — PAP IG, CT-NG NAA, HPV HIGH-RISK
C. trachomatis RNA, TMA: NOT DETECTED
HPV DNA High Risk: NOT DETECTED
N. gonorrhoeae RNA, TMA: NOT DETECTED

## 2017-06-15 DIAGNOSIS — Z1212 Encounter for screening for malignant neoplasm of rectum: Secondary | ICD-10-CM | POA: Diagnosis not present

## 2017-06-15 DIAGNOSIS — Z1211 Encounter for screening for malignant neoplasm of colon: Secondary | ICD-10-CM | POA: Diagnosis not present

## 2017-06-22 ENCOUNTER — Encounter: Payer: Self-pay | Admitting: Family Medicine

## 2017-06-22 LAB — COLOGUARD: Cologuard: NEGATIVE

## 2017-07-06 ENCOUNTER — Ambulatory Visit: Payer: Self-pay | Admitting: Podiatry

## 2017-07-12 ENCOUNTER — Other Ambulatory Visit: Payer: Self-pay | Admitting: Family Medicine

## 2017-07-12 DIAGNOSIS — G4709 Other insomnia: Secondary | ICD-10-CM

## 2017-07-21 DIAGNOSIS — M25562 Pain in left knee: Secondary | ICD-10-CM | POA: Diagnosis not present

## 2017-07-21 DIAGNOSIS — M25571 Pain in right ankle and joints of right foot: Secondary | ICD-10-CM | POA: Diagnosis not present

## 2017-07-21 DIAGNOSIS — M25511 Pain in right shoulder: Secondary | ICD-10-CM | POA: Diagnosis not present

## 2017-07-21 DIAGNOSIS — M25561 Pain in right knee: Secondary | ICD-10-CM | POA: Diagnosis not present

## 2017-08-31 ENCOUNTER — Other Ambulatory Visit: Payer: Self-pay | Admitting: Family Medicine

## 2017-08-31 DIAGNOSIS — E039 Hypothyroidism, unspecified: Secondary | ICD-10-CM

## 2017-10-12 ENCOUNTER — Other Ambulatory Visit: Payer: Self-pay | Admitting: Family Medicine

## 2017-10-12 DIAGNOSIS — E039 Hypothyroidism, unspecified: Secondary | ICD-10-CM

## 2017-10-12 NOTE — Telephone Encounter (Signed)
Patient scheduled for 11/24/17 °

## 2017-10-12 NOTE — Telephone Encounter (Signed)
Called [phone] and left voice message informing pt to schedule appt around 11-24-17

## 2017-10-12 NOTE — Telephone Encounter (Signed)
Refill request for thyroid medication. Levothyroxine to [facility].   Last Physical:05/27/17  Lab Results  Component Value Date   TSH 0.59 05/27/2017     No follow-ups on file.  Please schedule follow up around 11/24/17 (6 month F/U)

## 2017-10-13 ENCOUNTER — Telehealth: Payer: Self-pay

## 2017-10-13 NOTE — Telephone Encounter (Signed)
She will have to pay cash for the two weeks

## 2017-10-13 NOTE — Telephone Encounter (Signed)
Copied from [place] [id number]. Topic: Quick Communication - See Telephone Encounter >> Oct 13, 2017  2:41 PM Antonieta Iba C wrote: CRM for notification. See Telephone encounter for: 10/13/17.  Pt called in, she received the Rx for levothyroxine (SYNTHROID, LEVOTHROID) 50 MCG tablet at the pharmacy but pt states that her insurance wont pay for it because refill it to soon. Pt says that she is completely out [place]. Please assist pt further.   CB: [phone]   Please advise

## 2017-10-14 NOTE — Telephone Encounter (Signed)
I contacted this patient's pharmacy to see if they could get an override from her insurance company regarding her lost medication. He said that he would and then he will contact the patient. I then called this patient to let her know what was going on. She said thank you very much.

## 2017-10-15 NOTE — Telephone Encounter (Signed)
I have not heard anything as of today, but the pharmacist stated he will contact the patient if he had any additional questions. Please have her contact [facility] to check progress.

## 2017-10-15 NOTE — Telephone Encounter (Signed)
LVM to inform pt to contact the pharmacy with any further questions.

## 2017-10-15 NOTE — Telephone Encounter (Addendum)
°  Relation to pt: self Call back number: [phone]   Reason for call:  Patient checking on the status of message below, informed patient to contact pharmacy for an update

## 2017-10-18 ENCOUNTER — Telehealth: Payer: Self-pay

## 2017-10-18 ENCOUNTER — Other Ambulatory Visit: Payer: Self-pay | Admitting: Family Medicine

## 2017-10-18 DIAGNOSIS — E039 Hypothyroidism, unspecified: Secondary | ICD-10-CM

## 2017-10-18 NOTE — Telephone Encounter (Signed)
Copied from [place] [phone]. Topic: Referral - Request >> Oct 18, 2017  1:24 PM Marin Olp L wrote: Reason for CRM: Patient would like referral to Endocrinology at [facility] Please call patient once placed.

## 2017-10-20 NOTE — Telephone Encounter (Signed)
Left a message to inform the patient her referral for Endocrinology has been place and to be expecting a phone call from [facility].

## 2017-11-16 DIAGNOSIS — R1310 Dysphagia, unspecified: Secondary | ICD-10-CM | POA: Diagnosis not present

## 2017-11-16 DIAGNOSIS — E039 Hypothyroidism, unspecified: Secondary | ICD-10-CM | POA: Diagnosis not present

## 2017-11-24 ENCOUNTER — Ambulatory Visit: Payer: BLUE CROSS/BLUE SHIELD | Admitting: Family Medicine

## 2018-02-08 DIAGNOSIS — H5203 Hypermetropia, bilateral: Secondary | ICD-10-CM | POA: Diagnosis not present

## 2018-02-09 ENCOUNTER — Ambulatory Visit: Payer: BLUE CROSS/BLUE SHIELD | Admitting: Family Medicine

## 2018-02-09 ENCOUNTER — Encounter: Payer: Self-pay | Admitting: Family Medicine

## 2018-02-09 VITALS — BP 110/70 | HR 63 | Temp 98.3°F | Resp 16 | Ht 63.0 in | Wt 169.6 lb

## 2018-02-09 DIAGNOSIS — E039 Hypothyroidism, unspecified: Secondary | ICD-10-CM | POA: Diagnosis not present

## 2018-02-09 DIAGNOSIS — M2560 Stiffness of unspecified joint, not elsewhere classified: Secondary | ICD-10-CM

## 2018-02-09 DIAGNOSIS — M2550 Pain in unspecified joint: Secondary | ICD-10-CM

## 2018-02-09 DIAGNOSIS — Z8739 Personal history of other diseases of the musculoskeletal system and connective tissue: Secondary | ICD-10-CM | POA: Diagnosis not present

## 2018-02-09 DIAGNOSIS — Z862 Personal history of diseases of the blood and blood-forming organs and certain disorders involving the immune mechanism: Secondary | ICD-10-CM

## 2018-02-09 NOTE — Progress Notes (Signed)
Name: Sandra Chandler   MRN: [id number]    DOB: 1960/12/17   Date:02/09/2018       Progress Note  Subjective  Chief Complaint  Chief Complaint  Patient presents with  . Joint Stiffness    joint and back stiffness for more than one month. It is constant and gradually worsening. Stiffness is also in her feet. She has tired treating stiffness with Tylenol with no relieft. She stays active.  . back Stiffness    HPI  Pt presents with concern for joint stiffness worsening over the last several months.  She notes bilateral hips, knees, and feet, also has RIGHT shoulder pain (which has been chronic).  Stiffness and dull pain is worse in the mornings, but is really constant throughout the day.  She has a history of JRA, but was not treated as an adult. Had CRP, Sed rate, and ANA.  She notes dry mouth and dry eyes as well - has hx hypothyroidism. She also has hx anemia.  Has taken Tylenol without relief.    Patient Active Problem List   Diagnosis Date Noted  . Chondromalacia patellae 05/27/2017  . Bipolar affective disorder, most recent episode unspecified type, remission status unspecified 03/20/2015  . Major depressive disorder, recurrent, in full remission with anxious distress ([place]) 11/29/2014  . Genital herpes 11/29/2014  . Adult hypothyroidism 11/29/2014  . Menopause 11/29/2014  . Patella-femoral syndrome 11/29/2014  . Overweight 11/29/2014  . History of basal cell carcinoma 01/10/2013  . Vitamin D deficiency 09/18/2009    Social History   Tobacco Use  . Smoking status: Never Smoker  . Smokeless tobacco: Never Used  Substance Use Topics  . Alcohol use: Yes    Alcohol/week: 3.0 standard drinks    Types: 2 Cans of beer, 1 Glasses of wine per week     Current Outpatient Medications:  .  levothyroxine (SYNTHROID, LEVOTHROID) 50 MCG tablet, TAKE 1 TABLET DAILY, Disp: 30 tablet, Rfl: 0 .  valACYclovir (VALTREX) 500 MG tablet, Take 1 tablet (500 mg total) by mouth daily. And  three times daily prn outbreak, Disp: 35 tablet, Rfl: 2 .  aspirin EC 81 MG tablet, Take 1 tablet by mouth daily., Disp: , Rfl:  .  temazepam (RESTORIL) 15 MG capsule, TAKE 1 CAPSULE AT BEDTIME AS NEEDED FOR SLEEP (Patient not taking: Reported on 02/09/2018), Disp: 30 capsule, Rfl: 0  No Known Allergies  I personally reviewed active problem list, medication list, allergies, lab results with the patient/caregiver today.  ROS  Ten systems reviewed and is negative except as mentioned in HPI  Objective  Vitals:   02/09/18 1120  BP: 110/70  Pulse: 63  Resp: 16  Temp: 98.3 F (36.8 C)  TempSrc: Oral  SpO2: 99%  Weight: 169 lb 9.6 oz (76.9 kg)  Height: '5\' 3"'  (1.6 m)    Body mass index is 30.04 kg/m.  Nursing Note and Vital Signs reviewed.  Physical Exam  Constitutional: Patient appears well-developed and well-nourished. No distress.  HENT: Head: Normocephalic and atraumatic.  Cardiovascular: Normal rate, regular rhythm and normal heart sounds.  No murmur heard. No BLE edema. Pulmonary/Chest: Effort normal and breath sounds normal. No respiratory distress. Musculoskeletal: Normal range of motion, no joint effusions. No gross deformities.  No joint laxity to bilateral knees, BLE are minimally tender. Bilateral hips are stable without crepitus. Bilateral knees exhibit crepitus. Neurological: Pt is alert and oriented to person, place, and time. No cranial nerve deficit. Coordination, balance, strength, speech and gait  are normal.  Skin: Skin is warm and dry. No rash noted. No erythema.  Psychiatric: Patient has a normal mood and affect. behavior is normal. Judgment and thought content normal.  No results found for this or any previous visit (from the past 72 hour(s)).  Assessment & Plan  1. Arthralgia, unspecified joint - ANA - Sed Rate (ESR) - C-reactive protein - COMPLETE METABOLIC PANEL WITH GFR - CBC w/Diff/Platelet - TSH  2. Joint stiffness - ANA - Sed Rate (ESR) -  C-reactive protein  3. History of juvenile rheumatoid arthritis - ANA - Sed Rate (ESR) - C-reactive protein  4. History of anemia - CBC w/Diff/Platelet  5. Adult hypothyroidism - TSH

## 2018-02-10 LAB — COMPLETE METABOLIC PANEL WITH GFR
AG Ratio: 1.8 (calc) (ref 1.0–2.5)
ALBUMIN MSPROF: 4.9 g/dL (ref 3.6–5.1)
ALKALINE PHOSPHATASE (APISO): 53 U/L (ref 33–130)
ALT: 18 U/L (ref 6–29)
AST: 22 U/L (ref 10–35)
BILIRUBIN TOTAL: 0.4 mg/dL (ref 0.2–1.2)
BUN: 12 mg/dL (ref 7–25)
CO2: 27 mmol/L (ref 20–32)
CREATININE: 0.67 mg/dL (ref 0.50–1.05)
Calcium: 10.2 mg/dL (ref 8.6–10.4)
Chloride: 104 mmol/L (ref 98–110)
GFR, Est African American: 115 mL/min/{1.73_m2} (ref >=60)
GFR, Est Non African American: 99 mL/min/{1.73_m2} (ref >=60)
GLOBULIN: 2.8 g/dL (ref 1.9–3.7)
Glucose, Bld: 90 mg/dL (ref 65–139)
Potassium: 4 mmol/L (ref 3.5–5.3)
Sodium: 140 mmol/L (ref 135–146)
TOTAL PROTEIN: 7.7 g/dL (ref 6.1–8.1)

## 2018-02-10 LAB — CBC WITH DIFFERENTIAL/PLATELET
BASOS PCT: 0.8 %
Basophils Absolute: 51 cells/uL (ref 0–200)
Eosinophils Absolute: 102 cells/uL (ref 15–500)
Eosinophils Relative: 1.6 %
HEMATOCRIT: 37.8 % (ref 35.0–45.0)
HEMOGLOBIN: 12.9 g/dL (ref 11.7–15.5)
LYMPHS ABS: 1722 {cells}/uL (ref 850–3900)
MCH: 29.8 pg (ref 27.0–33.0)
MCHC: 34.1 g/dL (ref 32.0–36.0)
MCV: 87.3 fL (ref 80.0–100.0)
MPV: 11.4 fL (ref 7.5–12.5)
Monocytes Relative: 10.2 %
NEUTROS ABS: 3872 {cells}/uL (ref 1500–7800)
Neutrophils Relative %: 60.5 %
Platelets: 299 10*3/uL (ref 140–400)
RBC: 4.33 10*6/uL (ref 3.80–5.10)
RDW: 12.3 % (ref 11.0–15.0)
Total Lymphocyte: 26.9 %
WBC: 6.4 10*3/uL (ref 3.8–10.8)
WBCMIX: 653 {cells}/uL (ref 200–950)

## 2018-02-10 LAB — C-REACTIVE PROTEIN: CRP: 1.1 mg/L (ref <8.0)

## 2018-02-10 LAB — SEDIMENTATION RATE: Sed Rate: 9 mm/h (ref 0–30)

## 2018-02-10 LAB — TSH: TSH: 1.04 mIU/L

## 2018-02-16 ENCOUNTER — Telehealth: Payer: Self-pay | Admitting: Family Medicine

## 2018-02-16 NOTE — Telephone Encounter (Signed)
Patient called and she wanted to know about her ANA panel, I advised it was not back yet. I advised someone will call with the results when the doctor reviews them, she verbalized understanding.

## 2018-02-16 NOTE — Telephone Encounter (Signed)
Copied from [place] [phone]. Topic: Quick Communication - See Telephone Encounter >> Feb 16, 2018  9:32 AM Sheran Luz wrote: CRM for notification. See Telephone encounter for: 02/16/18.  Patient called to check status of lab results from 11/13- Specifically ANA. Please advise.

## 2018-02-21 NOTE — Telephone Encounter (Signed)
Spoke with Jenny Reichmann at [facility] and she states because the test was not released she did not know it was ordered. So the test ANA was not performed. Patient was called and notified she will come back in to have this test drawn.

## 2018-02-21 NOTE — Addendum Note (Signed)
Addended by: Inda Coke on: 02/21/2018 04:40 PM   Modules accepted: Orders

## 2018-02-21 NOTE — Telephone Encounter (Addendum)
Pt would like to know if you can look into the ANA lab results and why they are not back  ( ANA still says needs to be collected)

## 2018-02-21 NOTE — Addendum Note (Signed)
Addended by: Inda Coke on: 02/21/2018 04:35 PM   Modules accepted: Orders

## 2018-02-22 NOTE — Telephone Encounter (Signed)
Thank you for re-ordering.

## 2018-03-29 ENCOUNTER — Ambulatory Visit (INDEPENDENT_AMBULATORY_CARE_PROVIDER_SITE_OTHER): Payer: BLUE CROSS/BLUE SHIELD | Admitting: Family Medicine

## 2018-03-29 ENCOUNTER — Encounter: Payer: Self-pay | Admitting: Family Medicine

## 2018-03-29 VITALS — BP 110/80 | HR 80 | Temp 97.9°F | Resp 16 | Ht 63.0 in | Wt 172.0 lb

## 2018-03-29 DIAGNOSIS — E039 Hypothyroidism, unspecified: Secondary | ICD-10-CM | POA: Diagnosis not present

## 2018-03-29 DIAGNOSIS — M2560 Stiffness of unspecified joint, not elsewhere classified: Secondary | ICD-10-CM | POA: Diagnosis not present

## 2018-03-29 DIAGNOSIS — R41840 Attention and concentration deficit: Secondary | ICD-10-CM

## 2018-03-29 DIAGNOSIS — Z8739 Personal history of other diseases of the musculoskeletal system and connective tissue: Secondary | ICD-10-CM | POA: Diagnosis not present

## 2018-03-29 DIAGNOSIS — M2550 Pain in unspecified joint: Secondary | ICD-10-CM | POA: Diagnosis not present

## 2018-03-29 NOTE — Progress Notes (Signed)
Name: Sandra Chandler   MRN: [id number]    DOB: October 27, 1960   Date:03/29/2018       Progress Note  Subjective  Chief Complaint  Chief Complaint  Patient presents with  . concentration   HPI  PT presents with concern for ADHD - she has known she has it her whole life, but never got treatment.  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  She has had desk job in the past and she had issues then as well.  She finds that more active jobs work better for her, but she does not want to switch to a more active job at this time.  She notes increased difficulty with task completion, staying focused, and has trouble sitting at her desk for long periods of time.  So far, she has done okay at the job, but she is concerned about making mistakes due to poor concentration.  We do not have record of diagnosed ADHD on Epic, she has been a patient here for many years, so we will check in the old system to see if treatment was prescribed previously.  Patient Active Problem List   Diagnosis Date Noted  . Chondromalacia patellae 05/27/2017  . Bipolar affective disorder, most recent episode unspecified type, remission status unspecified 03/20/2015  . Major depressive disorder, recurrent, in full remission with anxious distress ([place]) 11/29/2014  . Genital herpes 11/29/2014  . Adult hypothyroidism 11/29/2014  . Menopause 11/29/2014  . Patella-femoral syndrome 11/29/2014  . Overweight 11/29/2014  . History of basal cell carcinoma 01/10/2013  . Vitamin D deficiency 09/18/2009    Social History   Tobacco Use  . Smoking status: Never Smoker  . Smokeless tobacco: Never Used  Substance Use Topics  . Alcohol use: Yes    Alcohol/week: 3.0 standard drinks    Types: 2 Cans of beer, 1 Glasses of wine per week     Current Outpatient Medications:  .  levothyroxine (SYNTHROID, LEVOTHROID) 50 MCG tablet,  TAKE 1 TABLET DAILY, Disp: 30 tablet, Rfl: 0 .  valACYclovir (VALTREX) 500 MG tablet, Take 1 tablet (500 mg total) by mouth daily. And three times daily prn outbreak, Disp: 35 tablet, Rfl: 2  No Known Allergies  I personally reviewed active problem list, medication list, allergies, notes from last encounter, lab results with the patient/caregiver today.  ROS  Constitutional: Negative for fever or weight change.  Respiratory: Negative for cough and shortness of breath.   Cardiovascular: Negative for chest pain or palpitations.  Gastrointestinal: Negative for abdominal pain, no bowel changes.  Musculoskeletal: Negative for gait problem or joint swelling.  Skin: Negative for rash.  Neurological: Negative for dizziness or headache.  No other specific complaints in a complete review of systems (except as listed in HPI above).  Objective  Vitals:   03/29/18 0744  BP: 110/80  Pulse: 80  Resp: 16  Temp: 97.9 F (36.6 C)  TempSrc: Oral  SpO2: 99%  Weight: 172 lb (78 kg)  Height: 5\' 3"  (1.6 m)   Body mass index is 30.47 kg/m.  Nursing Note and Vital Signs reviewed.  Physical Exam  Constitutional: Patient appears well-developed and well-nourished. No distress.  HENT: Head: Normocephalic and atraumatic. Eyes: Conjunctivae and EOM are normal. No scleral icterus. Neck: Normal range of motion. Neck supple. No JVD present. No thyromegaly present.  Cardiovascular: Normal rate, regular rhythm and normal heart  sounds.  No murmur heard. No BLE edema. Pulmonary/Chest: Effort normal and breath sounds normal. No respiratory distress. Musculoskeletal: Normal range of motion, no joint effusions. No gross deformities Neurological: Pt is alert and oriented to person, place, and time. No cranial nerve deficit. Coordination, balance, strength, speech and gait are normal.  Skin: Skin is warm and dry. No rash noted. No erythema.  Psychiatric: Patient has a normal mood and affect. behavior is normal.  Judgment and thought content normal.  No results found for this or any previous visit (from the past 72 hour(s)).  Assessment & Plan  1. Difficulty concentrating - We will look in old system for records of ADHD, if there, we will initiate treatment, if not, we will refer to psychiatry for evaluation.  -------Addendum---------- No diagnosis of ADHD is found in the old system - referral to psychiatry is placed.

## 2018-03-31 LAB — ANA: Anti Nuclear Antibody(ANA): NEGATIVE

## 2018-04-05 ENCOUNTER — Ambulatory Visit: Payer: Self-pay

## 2018-04-05 NOTE — Telephone Encounter (Addendum)
Pt with cough since the 03/29/18, No fever. Pt with occasional cough and coughs up yellow secretions. Pt stated that she feels like she is not breathing deep. Pt stated her chest felt hot and feels tight when she breathes. Home care advice given. Pt stated that she gets bronchitis twice per year. After giving home advice, pt asked "so this can be taken care of at home?' Advised pt that based onn her symptoms home care advice was given. Offered pt to make her an appointment several times but pt abruptly hung up. Called pt back and she stated she didn't want to take inhalers for 3 weeks. Offered again to make her appointment if she wants to be seen. Pt stated no and hung up. Called PCP office and updated Melissa on triage. Routed triage note to office.  Reason for Disposition . Cough  Answer Assessment - Initial Assessment Questions 1. ONSET: "When did the cough begin?"      03/29/18 2. SEVERITY: "How bad is the cough today?"      Occasional cough only when she needs to cough up something 3. RESPIRATORY DISTRESS: "Describe your breathing."      Not breathing deep 4. FEVER: "Do you have a fever?" If so, ask: "What is your temperature, how was it measured, and when did it start?"     no 5. SPUTUM: "Describe the color of your sputum" (clear, white, yellow, green)     yellow 6. HEMOPTYSIS: "Are you coughing up any blood?" If so ask: "How much?" (flecks, streaks, tablespoons, etc.)     no 7. CARDIAC HISTORY: "Do you have any history of heart disease?" (e.g., heart attack, congestive heart failure)      no 8. LUNG HISTORY: "Do you have any history of lung disease?"  (e.g., pulmonary embolus, asthma, emphysema)     no 9. PE RISK FACTORS: "Do you have a history of blood clots?" (or: recent major surgery, recent prolonged travel, bedridden)     no 10. OTHER SYMPTOMS: "Do you have any other symptoms?" (e.g., runny nose, wheezing, chest pain)       Chest feels hot and feels tight when you breathe 11.  PREGNANCY: "Is there any chance you are pregnant?" "When was your last menstrual period?"       n/a 12. TRAVEL: "Have you traveled out of the country in the last month?" (e.g., travel history, exposures)       no  Protocols used: Evan

## 2018-04-06 DIAGNOSIS — R05 Cough: Secondary | ICD-10-CM | POA: Diagnosis not present

## 2018-04-06 DIAGNOSIS — J0140 Acute pansinusitis, unspecified: Secondary | ICD-10-CM | POA: Diagnosis not present

## 2018-04-06 DIAGNOSIS — J40 Bronchitis, not specified as acute or chronic: Secondary | ICD-10-CM | POA: Diagnosis not present

## 2018-04-15 DIAGNOSIS — R05 Cough: Secondary | ICD-10-CM | POA: Diagnosis not present

## 2018-04-15 DIAGNOSIS — J0140 Acute pansinusitis, unspecified: Secondary | ICD-10-CM | POA: Diagnosis not present

## 2018-04-15 DIAGNOSIS — J22 Unspecified acute lower respiratory infection: Secondary | ICD-10-CM | POA: Diagnosis not present

## 2018-05-30 ENCOUNTER — Encounter: Payer: BLUE CROSS/BLUE SHIELD | Admitting: Family Medicine

## 2018-06-16 ENCOUNTER — Ambulatory Visit: Payer: BLUE CROSS/BLUE SHIELD | Admitting: Family Medicine

## 2018-06-21 ENCOUNTER — Telehealth: Payer: Self-pay

## 2018-06-21 NOTE — Telephone Encounter (Signed)
Copied from [place] [id number]. Topic: General - Inquiry >> Jun 21, 2018  9:43 AM Richardo Priest, NT wrote: Reason for CRM:  Patient called in stating she cannot sleep at all and would like something to help her with her sleeping and anxiety. Call back number is [phone].

## 2018-06-21 NOTE — Telephone Encounter (Signed)
Pt has been called and schedule for a Virtual OV, per Dr Ancil Boozer

## 2018-06-22 ENCOUNTER — Telehealth (INDEPENDENT_AMBULATORY_CARE_PROVIDER_SITE_OTHER): Payer: BLUE CROSS/BLUE SHIELD | Admitting: Family Medicine

## 2018-06-22 ENCOUNTER — Other Ambulatory Visit: Payer: Self-pay

## 2018-06-22 DIAGNOSIS — F39 Unspecified mood [affective] disorder: Secondary | ICD-10-CM

## 2018-06-22 DIAGNOSIS — G4709 Other insomnia: Secondary | ICD-10-CM

## 2018-06-22 MED ORDER — QUETIAPINE FUMARATE 25 MG PO TABS: 25.0000 mg | ORAL_TABLET | Freq: Every day | ORAL | 0 refills | Status: DC

## 2018-06-22 MED ORDER — DULOXETINE HCL 30 MG PO CPEP: 30.0000 mg | ORAL_CAPSULE | Freq: Every day | ORAL | 0 refills | Status: DC

## 2018-06-22 NOTE — Progress Notes (Signed)
Virtual Visit via Video Note  I connected with Sandra Chandler on 06/22/18 at  9:00 AM EDT by a video enabled telemedicine application and verified that I am speaking with the correct person using two identifiers.  She is at work at this time I am at the office    I discussed the limitations of evaluation and management by telemedicine and the availability of in person appointments. The patient expressed understanding and agreed to proceed.  History of Present Illness:   Hypothyroidism: she is feeling tired, gaining weight, but also eating more than usual. Last TSH was normal, because COVID-19 we will hold off on labs, but return if no improvement.   Mood Disorder: more edgy , feeling like crying, very worried, cannot sleep at night. Feeling more down than usual, worried about losing her job, she is also worried about her son that has been using drugs, denies mania. She came in December and saw Raelyn Ensign for focus problems but did not go see psychiatrist as advised. She is asking for a medication that will help her but not cause weight gain. She has taken Wellbutrin in the past, we will try duloxetine, discussed mania symptoms and advised to take Duloxetine with food to avoid nausea and start only one capsule for the first week after that 2 daily and have a telephone encounter in 2 weeks. We will add seroquel at night for mood and also for insomnia .Discussed packing healthy snacks for work   Insomnia: she falls asleep but wakes up during the night , feeling tired during the day   Observations/Objective:  Sounded tearful at times, but normal speech pattern , cooperative, normal judgement .    Telemedicine from 06/22/2018 in [facility]  PHQ-9 Total Score  15     GAD 7 : Generalized Anxiety Score 06/22/2018  Nervous, Anxious, on Edge 1  Control/stop worrying 1  Worry too much - different things 1  Trouble relaxing 2  Restless 0  Easily annoyed or irritable 2   Afraid - awful might happen 0  Total GAD 7 Score 7  Anxiety Difficulty Somewhat difficult    Assessment and Plan:   Follow Up Instructions:    I discussed the assessment and treatment plan with the patient. The patient was provided an opportunity to ask questions and all were answered. The patient agreed with the plan and demonstrated an understanding of the instructions.   The patient was advised to call back or seek an in-person evaluation if the symptoms worsen or if the condition fails to improve as anticipated.  I provided 21  minutes of non-face-to-face time during this encounter.   Loistine Chance, MD

## 2018-07-07 ENCOUNTER — Ambulatory Visit (INDEPENDENT_AMBULATORY_CARE_PROVIDER_SITE_OTHER): Payer: BLUE CROSS/BLUE SHIELD | Admitting: Family Medicine

## 2018-07-07 ENCOUNTER — Other Ambulatory Visit: Payer: Self-pay

## 2018-07-07 ENCOUNTER — Encounter: Payer: Self-pay | Admitting: Family Medicine

## 2018-07-07 DIAGNOSIS — G4709 Other insomnia: Secondary | ICD-10-CM | POA: Diagnosis not present

## 2018-07-07 DIAGNOSIS — A6000 Herpesviral infection of urogenital system, unspecified: Secondary | ICD-10-CM | POA: Diagnosis not present

## 2018-07-07 DIAGNOSIS — E039 Hypothyroidism, unspecified: Secondary | ICD-10-CM

## 2018-07-07 DIAGNOSIS — F39 Unspecified mood [affective] disorder: Secondary | ICD-10-CM

## 2018-07-07 DIAGNOSIS — B001 Herpesviral vesicular dermatitis: Secondary | ICD-10-CM

## 2018-07-07 MED ORDER — LEVOTHYROXINE SODIUM 50 MCG PO TABS: 50.0000 ug | ORAL_TABLET | Freq: Every day | ORAL | 0 refills | Status: DC

## 2018-07-07 MED ORDER — QUETIAPINE FUMARATE 25 MG PO TABS: 25.0000 mg | ORAL_TABLET | Freq: Every day | ORAL | 0 refills | Status: DC

## 2018-07-07 MED ORDER — VALACYCLOVIR HCL 500 MG PO TABS: 500.0000 mg | ORAL_TABLET | Freq: Every day | ORAL | 1 refills | Status: DC

## 2018-07-07 MED ORDER — DULOXETINE HCL 30 MG PO CPEP: 30.0000 mg | ORAL_CAPSULE | Freq: Every day | ORAL | 0 refills | Status: DC

## 2018-07-07 NOTE — Progress Notes (Signed)
Name: Sandra Chandler   MRN: [id number]    DOB: 05-Dec-1960   Date:07/07/2018       Progress Note  Subjective  Chief Complaint  Chief Complaint  Patient presents with  . Anxiety  . Insomnia    I connected with@ on 07/07/18 at 10:40 AM EDT by a video enabled telemedicine application and verified that I am speaking with the correct person using two identifiers.  I discussed the limitations of evaluation and management by telemedicine and the availability of in person appointments. The patient expressed understanding and agreed to proceed. Staff also discussed with the patient that there may be a patient responsible charge related to this service. Patient Location: at work  Provider Location: [facility]   HPI  Mood Disorder: patient had a virtual visit on 06/22/2018 secondary to increase in mood changes, she was feeling  more edgy , feeling like crying, very worried, and unable to sleep at night. Feeling more down than usual, she was worried about losing her job secondary to [employer]  worried about her son that has been using drugs. She was not feeling manic . She has taken Wellbutrin in the past, we started her on  Duloxetine.  We also gave her  seroquel at night for mood and also for insomnia. Today she states she is feeling well, she never went up on dose of duloxetine, she states no longer having problems sleeping, not feeling edgy and has not noticed any side effects of medication. Advised her to call back if she decides to go up to 60 mg, for now we will send a refill of 30 mg capsules  Hypothyroidism: she needs refills of her medication, last TSH was within normal limits , and secondary to COVID-19 we will not bring her to the office for lab work at this time  Herpes: she needs refill of medication, mostly fever blisters now because of the sunshine, no recent episodes of genital herpes   Patient Active Problem List   Diagnosis Date Noted  . Chondromalacia  patellae 05/27/2017  . Bipolar affective disorder, most recent episode unspecified type, remission status unspecified 03/20/2015  . Major depressive disorder, recurrent, in full remission with anxious distress ([place]) 11/29/2014  . Genital herpes 11/29/2014  . Adult hypothyroidism 11/29/2014  . Menopause 11/29/2014  . Patella-femoral syndrome 11/29/2014  . Overweight 11/29/2014  . History of basal cell carcinoma 01/10/2013  . Vitamin D deficiency 09/18/2009    Past Surgical History:  Procedure Laterality Date  . AUGMENTATION MAMMAPLASTY Bilateral    age 6 redone @ 5 years ago  . BILATERAL OOPHORECTOMY  05/2011  . BREAST SURGERY  1984  . CESAREAN SECTION     x2  . MOHS SURGERY  01/28/2013  . TUBAL LIGATION      Family History  Problem Relation Age of Onset  . Osteoporosis Mother   . Bipolar disorder Father   . Cancer Father        Lung  . Bipolar disorder Brother   . Breast cancer Maternal Grandmother 46    Social History   Socioeconomic History  . Marital status: Divorced    Spouse name: Not on file  . Number of children: 2  . Years of education: Not on file  . Highest education level: 12th grade  Occupational History  . Occupation: shipping and receiving     Comment: [employer]  Social Needs  . Financial resource strain: Not hard at all  . Food insecurity:  Worry: Never true    Inability: Never true  . Transportation needs:    Medical: No    Non-medical: No  Tobacco Use  . Smoking status: Never Smoker  . Smokeless tobacco: Never Used  Substance and Sexual Activity  . Alcohol use: Yes    Alcohol/week: 3.0 standard drinks    Types: 2 Cans of beer, 1 Glasses of wine per week  . Drug use: No  . Sexual activity: Not Currently    Partners: Male    Comment: dating, but boyfriend ED  Lifestyle  . Physical activity:    Days per week: 5 days    Minutes per session: 80 min  . Stress: To some extent  Relationships  . Social connections:    Talks on phone: More  than three times a week    Gets together: Twice a week    Attends religious service: Never    Active member of club or organization: No    Attends meetings of clubs or organizations: Never    Relationship status: Divorced  . Intimate partner violence:    Fear of current or ex partner: No    Emotionally abused: No    Physically abused: No    Forced sexual activity: No  Other Topics Concern  . Not on file  Social History Narrative   She lives alone, but sometimes stays with boyfriend.  Divorced, has two grown children.      Current Outpatient Medications:  .  DULoxetine (CYMBALTA) 30 MG capsule, Take 1-2 capsules (30-60 mg total) by mouth daily. First week after that 2 daily, Disp: 30 capsule, Rfl: 0 .  levothyroxine (SYNTHROID, LEVOTHROID) 50 MCG tablet, TAKE 1 TABLET DAILY, Disp: 30 tablet, Rfl: 0 .  QUEtiapine (SEROQUEL) 25 MG tablet, Take 1 tablet (25 mg total) by mouth at bedtime., Disp: 30 tablet, Rfl: 0 .  valACYclovir (VALTREX) 500 MG tablet, Take 1 tablet (500 mg total) by mouth daily. And three times daily prn outbreak, Disp: 35 tablet, Rfl: 2  No Known Allergies  I personally reviewed active problem list, medication list, allergies, family history, social history with the patient/caregiver today.   ROS  Ten systems reviewed and is negative except as mentioned in HPI   Objective  Virtual encounter, vitals not obtained.  There is no height or weight on file to calculate BMI.  Physical Exam  Awake, alert and in good spirits, walking outside her work   PHQ2/9: Depression screen [facility] 2/9 07/07/2018 06/22/2018 02/09/2018 05/27/2017 11/25/2016  Decreased Interest 0 1 0 0 0  Down, Depressed, Hopeless 0 2 0 0 0  PHQ - 2 Score 0 3 0 0 0  Altered sleeping 0 3 0 2 -  Tired, decreased energy 0 3 1 1  -  Change in appetite 0 3 0 0 -  Feeling bad or failure about yourself  0 0 0 0 -  Trouble concentrating 0 2 0 0 -  Moving slowly or fidgety/restless 0 1 0 0 -  Suicidal  thoughts - 0 0 0 -  PHQ-9 Score 0 15 1 3  -  Difficult doing work/chores - Very difficult Not difficult at all Not difficult at all -   PHQ-2/9 Result is negative.    Fall Risk: Fall Risk  07/07/2018 06/22/2018 03/29/2018 02/09/2018 05/27/2017  Falls in the past year? 0 0 0 0 No  Number falls in past yr: 0 0 0 - -  Injury with Fall? 0 0 0 - -  Assessment & Plan  1. Mood disorder (HCC)  - DULoxetine (CYMBALTA) 30 MG capsule; Take 1-2 capsules (30-60 mg total) by mouth daily. First week after that 2 daily  Dispense: 30 capsule; Refill: 0 - QUEtiapine (SEROQUEL) 25 MG tablet; Take 1 tablet (25 mg total) by mouth at bedtime.  Dispense: 30 tablet; Refill: 0  2. Adult hypothyroidism  We will recheck labs on her next visit due to COVID-19  - levothyroxine (SYNTHROID, LEVOTHROID) 50 MCG tablet; Take 1 tablet (50 mcg total) by mouth daily.  Dispense: 90 tablet; Refill: 1  3. Other insomnia  - QUEtiapine (SEROQUEL) 25 MG tablet; Take 1 tablet (25 mg total) by mouth at bedtime.  Dispense: 30 tablet; Refill: 0  4. Herpes simplex infection of genitourinary system  - valACYclovir (VALTREX) 500 MG tablet; Take 1 tablet (500 mg total) by mouth daily. And three times daily prn outbreak  Dispense: 35 tablet; Refill: 2  5. Fever blister  - valACYclovir (VALTREX) 500 MG tablet; Take 1 tablet (500 mg total) by mouth daily. And three times daily prn outbreak  Dispense: 35 tablet; Refill: 2   I discussed the assessment and treatment plan with the patient. The patient was provided an opportunity to ask questions and all were answered. The patient agreed with the plan and demonstrated an understanding of the instructions.  The patient was advised to call back or seek an in-person evaluation if the symptoms worsen or if the condition fails to improve as anticipated.  I provided 15  minutes of non-face-to-face time during this encounter.

## 2018-07-25 ENCOUNTER — Other Ambulatory Visit: Payer: Self-pay | Admitting: Family Medicine

## 2018-07-25 DIAGNOSIS — F39 Unspecified mood [affective] disorder: Secondary | ICD-10-CM

## 2018-07-25 DIAGNOSIS — G4709 Other insomnia: Secondary | ICD-10-CM

## 2018-08-29 ENCOUNTER — Other Ambulatory Visit: Payer: Self-pay | Admitting: Family Medicine

## 2018-08-29 DIAGNOSIS — Z1231 Encounter for screening mammogram for malignant neoplasm of breast: Secondary | ICD-10-CM

## 2018-09-14 DIAGNOSIS — S46011A Strain of muscle(s) and tendon(s) of the rotator cuff of right shoulder, initial encounter: Secondary | ICD-10-CM | POA: Diagnosis not present

## 2018-09-20 ENCOUNTER — Other Ambulatory Visit: Payer: Self-pay | Admitting: Family Medicine

## 2018-09-20 DIAGNOSIS — E039 Hypothyroidism, unspecified: Secondary | ICD-10-CM

## 2018-09-21 DIAGNOSIS — M25511 Pain in right shoulder: Secondary | ICD-10-CM | POA: Diagnosis not present

## 2018-09-22 ENCOUNTER — Encounter: Payer: Self-pay | Admitting: Family Medicine

## 2018-09-26 DIAGNOSIS — R197 Diarrhea, unspecified: Secondary | ICD-10-CM | POA: Diagnosis not present

## 2018-09-26 DIAGNOSIS — R05 Cough: Secondary | ICD-10-CM | POA: Diagnosis not present

## 2018-09-26 DIAGNOSIS — J40 Bronchitis, not specified as acute or chronic: Secondary | ICD-10-CM | POA: Diagnosis not present

## 2018-09-26 DIAGNOSIS — R112 Nausea with vomiting, unspecified: Secondary | ICD-10-CM | POA: Diagnosis not present

## 2018-10-04 ENCOUNTER — Encounter: Payer: Self-pay | Admitting: Family Medicine

## 2018-10-04 ENCOUNTER — Other Ambulatory Visit: Payer: Self-pay | Admitting: Family Medicine

## 2018-10-04 MED ORDER — TRAZODONE HCL 50 MG PO TABS: 25.0000 mg | ORAL_TABLET | Freq: Every evening | ORAL | 0 refills | Status: DC | PRN

## 2018-10-10 ENCOUNTER — Encounter: Payer: Self-pay | Admitting: Family Medicine

## 2018-10-10 ENCOUNTER — Other Ambulatory Visit: Payer: Self-pay

## 2018-10-10 ENCOUNTER — Ambulatory Visit
Admission: RE | Admit: 2018-10-10 | Discharge: 2018-10-10 | Disposition: A | Discharge status: Home / Self Care | Payer: BC Managed Care – PPO | Source: Ambulatory Visit | Attending: Family Medicine | Admitting: Family Medicine

## 2018-10-10 DIAGNOSIS — Z1231 Encounter for screening mammogram for malignant neoplasm of breast: Secondary | ICD-10-CM | POA: Diagnosis not present

## 2018-10-12 DIAGNOSIS — M7521 Bicipital tendinitis, right shoulder: Secondary | ICD-10-CM | POA: Diagnosis not present

## 2018-10-12 DIAGNOSIS — S43491A Other sprain of right shoulder joint, initial encounter: Secondary | ICD-10-CM | POA: Diagnosis not present

## 2018-10-12 DIAGNOSIS — S46011A Strain of muscle(s) and tendon(s) of the rotator cuff of right shoulder, initial encounter: Secondary | ICD-10-CM | POA: Diagnosis not present

## 2018-10-12 DIAGNOSIS — M24111 Other articular cartilage disorders, right shoulder: Secondary | ICD-10-CM | POA: Diagnosis not present

## 2018-10-12 DIAGNOSIS — G8918 Other acute postprocedural pain: Secondary | ICD-10-CM | POA: Diagnosis not present

## 2018-10-12 DIAGNOSIS — S46291A Other injury of muscle, fascia and tendon of other parts of biceps, right arm, initial encounter: Secondary | ICD-10-CM | POA: Diagnosis not present

## 2018-10-12 DIAGNOSIS — M75121 Complete rotator cuff tear or rupture of right shoulder, not specified as traumatic: Secondary | ICD-10-CM | POA: Diagnosis not present

## 2018-10-12 DIAGNOSIS — M7551 Bursitis of right shoulder: Secondary | ICD-10-CM | POA: Diagnosis not present

## 2018-10-12 DIAGNOSIS — M7541 Impingement syndrome of right shoulder: Secondary | ICD-10-CM | POA: Diagnosis not present

## 2018-10-12 DIAGNOSIS — M19011 Primary osteoarthritis, right shoulder: Secondary | ICD-10-CM | POA: Diagnosis not present

## 2018-10-12 HISTORY — PX: ROTATOR CUFF REPAIR: SHX139

## 2018-10-19 DIAGNOSIS — M19011 Primary osteoarthritis, right shoulder: Secondary | ICD-10-CM | POA: Diagnosis not present

## 2018-10-20 ENCOUNTER — Ambulatory Visit: Payer: Self-pay | Admitting: Maternal Newborn

## 2018-10-20 DIAGNOSIS — M25511 Pain in right shoulder: Secondary | ICD-10-CM | POA: Diagnosis not present

## 2018-10-20 DIAGNOSIS — M25611 Stiffness of right shoulder, not elsewhere classified: Secondary | ICD-10-CM | POA: Diagnosis not present

## 2018-10-20 DIAGNOSIS — M6281 Muscle weakness (generalized): Secondary | ICD-10-CM | POA: Diagnosis not present

## 2018-10-24 ENCOUNTER — Encounter: Payer: Self-pay | Admitting: Family Medicine

## 2018-10-24 ENCOUNTER — Ambulatory Visit: Payer: BLUE CROSS/BLUE SHIELD | Admitting: Family Medicine

## 2018-10-24 ENCOUNTER — Other Ambulatory Visit: Payer: Self-pay

## 2018-10-24 VITALS — BP 114/76 | HR 97 | Temp 96.8°F | Resp 16 | Ht 63.39 in | Wt 182.7 lb

## 2018-10-24 DIAGNOSIS — Z01419 Encounter for gynecological examination (general) (routine) without abnormal findings: Secondary | ICD-10-CM | POA: Diagnosis not present

## 2018-10-24 DIAGNOSIS — Z862 Personal history of diseases of the blood and blood-forming organs and certain disorders involving the immune mechanism: Secondary | ICD-10-CM

## 2018-10-24 DIAGNOSIS — E785 Hyperlipidemia, unspecified: Secondary | ICD-10-CM

## 2018-10-24 DIAGNOSIS — E559 Vitamin D deficiency, unspecified: Secondary | ICD-10-CM | POA: Diagnosis not present

## 2018-10-24 DIAGNOSIS — E039 Hypothyroidism, unspecified: Secondary | ICD-10-CM | POA: Diagnosis not present

## 2018-10-24 DIAGNOSIS — Z131 Encounter for screening for diabetes mellitus: Secondary | ICD-10-CM | POA: Diagnosis not present

## 2018-10-24 DIAGNOSIS — F39 Unspecified mood [affective] disorder: Secondary | ICD-10-CM | POA: Diagnosis not present

## 2018-10-24 NOTE — Progress Notes (Signed)
Name: Sandra Chandler   MRN: [id number]    DOB: 18-Mar-1961   Date:10/24/2018       Progress Note  Subjective  Chief Complaint  Chief Complaint  Patient presents with  . Annual Exam    HPI   Patient presents for annual CPE and follow up.  Hypothyroidism: taking supplements, gained 21 lbs in the past year, she states skin is always dry and hair is always thin but she states chronic, compliant with medications, we will recheck TSH  Obesity: this the heaviest she has ever been, very frustrated, she stopped taking Seroquel for sleep, and we gave her trazodone, however likely from inactivity, discussed try to walk   Mood disorder : history of bipolar, not on medications, still has insomnia, currently not on medication , stopped because she felt it was causing weight gain  Recent rotator cuff repair on right side: still in pain, she is on PT and still has pain medication at home.   Diet: small portion, fruit, vegetables Exercise: currently not active, since COVID-19 and gyms closed   USPSTF grade A and B recommendations    Office Visit from 10/24/2018 in [facility]  AUDIT-C Score  1    Hypertension: BP Readings from Last 3 Encounters:  10/24/18 114/76  03/29/18 110/80  02/09/18 110/70   Obesity: Wt Readings from Last 3 Encounters:  10/24/18 182 lb 11.2 oz (82.9 kg)  03/29/18 172 lb (78 kg)  02/09/18 169 lb 9.6 oz (76.9 kg)   BMI Readings from Last 3 Encounters:  10/24/18 31.97 kg/m  03/29/18 30.47 kg/m  02/09/18 30.04 kg/m    Hep C Screening: Up to date  STD testing and prevention (HIV/chl/gon/syphilis): not interested  Intimate partner violence: negative  Sexual History/Pain during Intercourse: not sexually active  Menstrual History/LMP/Abnormal Bleeding: discussed post-menopausal bleeding and need to follow up Incontinence Symptoms: no problems  Advanced Care Planning: A voluntary discussion about advance care planning including the  explanation and discussion of advance directives.  Discussed health care proxy and Living will, and the patient was able to identify a health care proxy as Issabella Rix .  Patient does not have a living will at present time. If patient does have living will, I have requested they bring this to the clinic to be scanned in to their chart.  Breast cancer: 10/10/2018 normal  BRCA gene screening: maternal grandmother had breast cancer, and one cousin that is going to be genetically tested  Cervical cancer screening: up to date  Osteoporosis Screening: discussed high calcium diet and vitamin D supplementation   Lipids:  Lab Results  Component Value Date   CHOL 235 (H) 02/01/2017   CHOL 220 (H) 12/03/2015   CHOL 219 (H) 11/29/2014   Lab Results  Component Value Date   HDL 81 02/01/2017   HDL 75 12/03/2015   HDL 69 11/29/2014   Lab Results  Component Value Date   LDLCALC 138 (H) 02/01/2017   LDLCALC 130 (H) 12/03/2015   LDLCALC 134 (H) 11/29/2014   Lab Results  Component Value Date   TRIG 72 02/01/2017   TRIG 73 12/03/2015   TRIG 82 11/29/2014   Lab Results  Component Value Date   CHOLHDL 2.9 02/01/2017   CHOLHDL 2.9 12/03/2015   CHOLHDL 3.2 11/29/2014   No results found for: LDLDIRECT  Glucose:  Glucose  Date Value Ref Range Status  06/03/2011 85 65 - 99 mg/dL Final   Glucose, Bld  Date Value Ref Range Status  02/09/2018 90 65 - 139 mg/dL Final    Comment:    .        Non-fasting reference interval .   02/01/2017 86 65 - 99 mg/dL Final    Comment:    .            Fasting reference interval .   12/03/2015 80 65 - 99 mg/dL Final    Skin cancer: sees Dermatologist yearly  Colorectal cancer: up to date 2019  Lung cancer:   Low Dose CT Chest recommended if Age 52-80 years, 30 pack-year currently smoking OR have quit w/in 15years. Patient does not qualify.   [id number]   Patient Active Problem List   Diagnosis Date Noted  . Chondromalacia patellae 05/27/2017   . Bipolar affective disorder, most recent episode unspecified type, remission status unspecified 03/20/2015  . Major depressive disorder, recurrent, in full remission with anxious distress ([place]) 11/29/2014  . Genital herpes 11/29/2014  . Adult hypothyroidism 11/29/2014  . Menopause 11/29/2014  . Patella-femoral syndrome 11/29/2014  . Overweight 11/29/2014  . History of basal cell carcinoma 01/10/2013  . Vitamin D deficiency 09/18/2009    Past Surgical History:  Procedure Laterality Date  . AUGMENTATION MAMMAPLASTY Bilateral    age 58 redone @ 5 years ago  . BILATERAL OOPHORECTOMY  05/2011  . BREAST SURGERY  1984  . CESAREAN SECTION     x2  . MOHS SURGERY  01/28/2013  . ROTATOR CUFF REPAIR Right 10/12/2018   Dr. Katha Hamming  . TUBAL LIGATION      Family History  Problem Relation Age of Onset  . Osteoporosis Mother   . Bipolar disorder Father   . Cancer Father        Lung  . Bipolar disorder Brother   . Breast cancer Maternal Grandmother 60  . Melanoma Paternal Grandmother     Social History   Socioeconomic History  . Marital status: Divorced    Spouse name: Not on file  . Number of children: 2  . Years of education: Not on file  . Highest education level: 12th grade  Occupational History  . Occupation: Geologist, engineering     Comment: [employer]  Social Needs  . Financial resource strain: Not hard at all  . Food insecurity    Worry: Never true    Inability: Never true  . Transportation needs    Medical: No    Non-medical: No  Tobacco Use  . Smoking status: Never Smoker  . Smokeless tobacco: Never Used  Substance and Sexual Activity  . Alcohol use: Yes    Alcohol/week: 3.0 standard drinks    Types: 2 Cans of beer, 1 Glasses of wine per week  . Drug use: No  . Sexual activity: Not Currently    Partners: Male    Comment: dating, but boyfriend ED  Lifestyle  . Physical activity    Days per week: 0 days    Minutes per session: 0 min  . Stress: Only a little   Relationships  . Social connections    Talks on phone: More than three times a week    Gets together: Twice a week    Attends religious service: Never    Active member of club or organization: No    Attends meetings of clubs or organizations: Never    Relationship status: Divorced  . Intimate partner violence    Fear of current or ex partner: No    Emotionally abused: No    Physically abused: No  Forced sexual activity: No  Other Topics Concern  . Not on file  Social History Narrative   She lives alone, but sometimes stays with boyfriend.  Divorced, has two grown children.      Current Outpatient Medications:  .  cyclobenzaprine (FLEXERIL) 5 MG tablet, , Disp: , Rfl:  .  levothyroxine (SYNTHROID) 50 MCG tablet, TAKE ONE TABLET EVERY DAY, Disp: 90 tablet, Rfl: 0 .  oxyCODONE (OXY IR/ROXICODONE) 5 MG immediate release tablet, , Disp: , Rfl:  .  traZODone (DESYREL) 50 MG tablet, Take 0.5-1 tablets (25-50 mg total) by mouth at bedtime as needed for sleep. In place of seroquel (Patient not taking: Reported on 10/24/2018), Disp: 30 tablet, Rfl: 0 .  valACYclovir (VALTREX) 500 MG tablet, Take 1 tablet (500 mg total) by mouth daily. And three times daily prn outbreak (Patient not taking: Reported on 10/24/2018), Disp: 35 tablet, Rfl: 1  No Known Allergies   ROS  Constitutional: Negative for fever or weight change.  Respiratory: Negative for cough and shortness of breath.   Cardiovascular: Negative for chest pain or palpitations.  Gastrointestinal: Negative for abdominal pain, no bowel changes.  Musculoskeletal: Negative for gait problem or joint swelling.  Skin: Negative for rash.  Neurological: Negative for dizziness or headache.  No other specific complaints in a complete review of systems (except as listed in HPI above).  Objective  Vitals:   10/24/18 1054  BP: 114/76  Pulse: 97  Resp: 16  Temp: (!) 96.8 F (36 C)  TempSrc: Temporal  SpO2: 97%  Weight: 182 lb 11.2 oz  (82.9 kg)  Height: 5' 3.39" (1.61 m)    Body mass index is 31.97 kg/m.  Physical Exam  Constitutional: Patient appears well-developed and well-nourished. Obese. No distress.  HENT: Head: Normocephalic and atraumatic. Ears: B TMs ok, no erythema or effusion; Nose: Nose normal.  Eyes: Conjunctivae and EOM are normal. Pupils are equal, round, and reactive to light. No scleral icterus.  Neck: Normal range of motion. Neck supple. No JVD present. No thyromegaly present.  Cardiovascular: Normal rate, regular rhythm and normal heart sounds.  No murmur heard. No BLE edema. Pulmonary/Chest: Effort normal and breath sounds normal. No respiratory distress. Abdominal: Soft. Bowel sounds are normal, no distension. There is no tenderness. no masses Breast: no lumps or masses, no nipple discharge or rashes FEMALE GENITALIA:  External genitalia , vaginal introitus with signs of atrophy  External urethra normal RECTAL: not done Musculoskeletal: decrease rom of right shoulder because of surgery Neurological: he is alert and oriented to person, place, and time. No cranial nerve deficit. Coordination, balance, strength, speech and gait are normal.  Skin: Skin is warm and dry. No rash noted. No erythema.  Psychiatric: Patient has a normal mood and affect. behavior is normal. Judgment and thought content normal.   PHQ2/9: Depression screen [facility] 2/9 10/24/2018 07/07/2018 06/22/2018 02/09/2018 05/27/2017  Decreased Interest 1 0 1 0 0  Down, Depressed, Hopeless 1 0 2 0 0  PHQ - 2 Score 2 0 3 0 0  Altered sleeping 1 0 3 0 2  Tired, decreased energy 1 0 _0 Change in appetite 0 0 3 0 0  Feeling bad or failure about yourself  0 0 0 0 0  Trouble concentrating 1 0 2 0 0  Moving slowly or fidgety/restless 0 0 1 0 0  Suicidal thoughts 0 - 0 0 0  PHQ-9 Score 5 0 _1 Difficult doing work/chores Somewhat difficult -  Very difficult Not difficult at all Not difficult at all   Upset about her weight -positive  screen    Fall Risk: Fall Risk  10/24/2018 07/07/2018 06/22/2018 03/29/2018 02/09/2018  Falls in the past year? 0 0 0 0 0  Number falls in past yr: 0 0 0 0 -  Injury with Fall? 0 0 0 0 -     Functional Status Survey: Is the patient deaf or have difficulty hearing?: No Does the patient have difficulty seeing, even when wearing glasses/contacts?: Yes Does the patient have difficulty concentrating, remembering, or making decisions?: No Does the patient have difficulty walking or climbing stairs?: No Does the patient have difficulty dressing or bathing?: No Does the patient have difficulty doing errands alone such as visiting a doctor's office or shopping?: No   Assessment & Plan  1. Adult hypothyroidism  - TSH  2. Mood disorder ([place])  Upset about her weight, not taking sleeping medication because of weight gain   3. Well woman exam  - COMPLETE METABOLIC PANEL WITH GFR  4. Dyslipidemia  - Lipid panel  5. Vitamin D deficiency  - VITAMIN D 25 Hydroxy (Vit-D Deficiency, Fractures)  6. History of anemia  - CBC with Differential/Platelet  7. Diabetes mellitus screening  - Hemoglobin A1c  -USPSTF grade A and B recommendations reviewed with patient; age-appropriate recommendations, preventive care, screening tests, etc discussed and encouraged; healthy living encouraged; see AVS for patient education given to patient -Discussed importance of 150 minutes of physical activity weekly, eat two servings of fish weekly, eat one serving of tree nuts ( cashews, pistachios, pecans, almonds.Marland Kitchen) every other day, eat 6 servings of fruit/vegetables daily and drink plenty of water and avoid sweet beverages.

## 2018-10-24 NOTE — Patient Instructions (Signed)

## 2018-10-25 DIAGNOSIS — M25611 Stiffness of right shoulder, not elsewhere classified: Secondary | ICD-10-CM | POA: Diagnosis not present

## 2018-10-25 DIAGNOSIS — M6281 Muscle weakness (generalized): Secondary | ICD-10-CM | POA: Diagnosis not present

## 2018-10-25 DIAGNOSIS — M25511 Pain in right shoulder: Secondary | ICD-10-CM | POA: Diagnosis not present

## 2018-10-25 LAB — CBC WITH DIFFERENTIAL/PLATELET
Absolute Monocytes: 710 cells/uL (ref 200–950)
Basophils Absolute: 47 cells/uL (ref 0–200)
Basophils Relative: 0.6 %
Eosinophils Absolute: 140 cells/uL (ref 15–500)
Eosinophils Relative: 1.8 %
HCT: 35.9 % (ref 35.0–45.0)
Hemoglobin: 11.9 g/dL (ref 11.7–15.5)
Lymphs Abs: 1763 cells/uL (ref 850–3900)
MCH: 29.5 pg (ref 27.0–33.0)
MCHC: 33.1 g/dL (ref 32.0–36.0)
MCV: 88.9 fL (ref 80.0–100.0)
MPV: 10.8 fL (ref 7.5–12.5)
Monocytes Relative: 9.1 %
Neutro Abs: 5140 cells/uL (ref 1500–7800)
Neutrophils Relative %: 65.9 %
Platelets: 339 10*3/uL (ref 140–400)
RBC: 4.04 10*6/uL (ref 3.80–5.10)
RDW: 12.6 % (ref 11.0–15.0)
Total Lymphocyte: 22.6 %
WBC: 7.8 10*3/uL (ref 3.8–10.8)

## 2018-10-25 LAB — TSH: TSH: 1.07 mIU/L

## 2018-10-25 LAB — COMPLETE METABOLIC PANEL WITH GFR
AG Ratio: 1.6 (calc) (ref 1.0–2.5)
ALT: 23 U/L (ref 6–29)
AST: 25 U/L (ref 10–35)
Albumin: 4.7 g/dL (ref 3.6–5.1)
Alkaline phosphatase (APISO): 62 U/L (ref 37–153)
BUN: 14 mg/dL (ref 7–25)
CO2: 22 mmol/L (ref 20–32)
Calcium: 9.5 mg/dL (ref 8.6–10.4)
Chloride: 101 mmol/L (ref 98–110)
Creat: 0.75 mg/dL (ref 0.50–1.05)
GFR, Est African American: 104 mL/min/{1.73_m2} (ref >=60)
GFR, Est Non African American: 90 mL/min/{1.73_m2} (ref >=60)
Globulin: 2.9 g/dL (calc) (ref 1.9–3.7)
Glucose, Bld: 89 mg/dL (ref 65–99)
Potassium: 3.7 mmol/L (ref 3.5–5.3)
Sodium: 138 mmol/L (ref 135–146)
Total Bilirubin: 0.4 mg/dL (ref 0.2–1.2)
Total Protein: 7.6 g/dL (ref 6.1–8.1)

## 2018-10-25 LAB — VITAMIN D 25 HYDROXY (VIT D DEFICIENCY, FRACTURES): Vit D, 25-Hydroxy: 30 ng/mL (ref 30–100)

## 2018-10-25 LAB — HEMOGLOBIN A1C
Hgb A1c MFr Bld: 5.4 % of total Hgb (ref <5.7)
Mean Plasma Glucose: 108 (calc)
eAG (mmol/L): 6 (calc)

## 2018-10-25 LAB — LIPID PANEL
Cholesterol: 249 mg/dL — ABNORMAL HIGH (ref <200)
HDL: 61 mg/dL (ref >=50)
LDL Cholesterol (Calc): 168 mg/dL (calc) — ABNORMAL HIGH
Non-HDL Cholesterol (Calc): 188 mg/dL (calc) — ABNORMAL HIGH (ref <130)
Total CHOL/HDL Ratio: 4.1 (calc) (ref <5.0)
Triglycerides: 93 mg/dL (ref <150)

## 2018-10-27 DIAGNOSIS — M6281 Muscle weakness (generalized): Secondary | ICD-10-CM | POA: Diagnosis not present

## 2018-10-27 DIAGNOSIS — M25611 Stiffness of right shoulder, not elsewhere classified: Secondary | ICD-10-CM | POA: Diagnosis not present

## 2018-10-27 DIAGNOSIS — M25511 Pain in right shoulder: Secondary | ICD-10-CM | POA: Diagnosis not present

## 2018-11-01 DIAGNOSIS — M6281 Muscle weakness (generalized): Secondary | ICD-10-CM | POA: Diagnosis not present

## 2018-11-01 DIAGNOSIS — M25611 Stiffness of right shoulder, not elsewhere classified: Secondary | ICD-10-CM | POA: Diagnosis not present

## 2018-11-01 DIAGNOSIS — M25511 Pain in right shoulder: Secondary | ICD-10-CM | POA: Diagnosis not present

## 2018-11-02 DIAGNOSIS — X32XXXA Exposure to sunlight, initial encounter: Secondary | ICD-10-CM | POA: Diagnosis not present

## 2018-11-02 DIAGNOSIS — L570 Actinic keratosis: Secondary | ICD-10-CM | POA: Diagnosis not present

## 2018-11-02 DIAGNOSIS — Z08 Encounter for follow-up examination after completed treatment for malignant neoplasm: Secondary | ICD-10-CM | POA: Diagnosis not present

## 2018-11-02 DIAGNOSIS — Z85828 Personal history of other malignant neoplasm of skin: Secondary | ICD-10-CM | POA: Diagnosis not present

## 2018-11-02 DIAGNOSIS — L814 Other melanin hyperpigmentation: Secondary | ICD-10-CM | POA: Diagnosis not present

## 2018-11-03 DIAGNOSIS — M25511 Pain in right shoulder: Secondary | ICD-10-CM | POA: Diagnosis not present

## 2018-11-03 DIAGNOSIS — M25611 Stiffness of right shoulder, not elsewhere classified: Secondary | ICD-10-CM | POA: Diagnosis not present

## 2018-11-03 DIAGNOSIS — M6281 Muscle weakness (generalized): Secondary | ICD-10-CM | POA: Diagnosis not present

## 2018-11-10 ENCOUNTER — Encounter: Payer: Self-pay | Admitting: Family Medicine

## 2018-11-10 DIAGNOSIS — M6281 Muscle weakness (generalized): Secondary | ICD-10-CM | POA: Diagnosis not present

## 2018-11-10 DIAGNOSIS — M25611 Stiffness of right shoulder, not elsewhere classified: Secondary | ICD-10-CM | POA: Diagnosis not present

## 2018-11-10 DIAGNOSIS — M25511 Pain in right shoulder: Secondary | ICD-10-CM | POA: Diagnosis not present

## 2018-11-15 DIAGNOSIS — M25511 Pain in right shoulder: Secondary | ICD-10-CM | POA: Diagnosis not present

## 2018-11-15 DIAGNOSIS — M25611 Stiffness of right shoulder, not elsewhere classified: Secondary | ICD-10-CM | POA: Diagnosis not present

## 2018-11-15 DIAGNOSIS — M6281 Muscle weakness (generalized): Secondary | ICD-10-CM | POA: Diagnosis not present

## 2018-11-22 ENCOUNTER — Other Ambulatory Visit: Payer: Self-pay | Admitting: Family Medicine

## 2018-11-22 ENCOUNTER — Encounter: Payer: Self-pay | Admitting: Family Medicine

## 2018-11-22 MED ORDER — ARIPIPRAZOLE 2 MG PO TABS: 2.0000 mg | ORAL_TABLET | Freq: Every evening | ORAL | 0 refills | Status: DC

## 2018-11-29 DIAGNOSIS — M25611 Stiffness of right shoulder, not elsewhere classified: Secondary | ICD-10-CM | POA: Diagnosis not present

## 2018-11-29 DIAGNOSIS — M25511 Pain in right shoulder: Secondary | ICD-10-CM | POA: Diagnosis not present

## 2018-11-29 DIAGNOSIS — M6281 Muscle weakness (generalized): Secondary | ICD-10-CM | POA: Diagnosis not present

## 2018-12-14 ENCOUNTER — Ambulatory Visit
Admission: RE | Admit: 2018-12-14 | Discharge: 2018-12-14 | Disposition: A | Discharge status: Home / Self Care | Payer: BC Managed Care – PPO | Source: Ambulatory Visit | Attending: Orthopedic Surgery | Admitting: Orthopedic Surgery

## 2018-12-14 ENCOUNTER — Other Ambulatory Visit: Payer: Self-pay

## 2018-12-14 ENCOUNTER — Other Ambulatory Visit: Payer: Self-pay | Admitting: Orthopedic Surgery

## 2018-12-14 DIAGNOSIS — M25762 Osteophyte, left knee: Secondary | ICD-10-CM | POA: Diagnosis not present

## 2018-12-14 DIAGNOSIS — M170 Bilateral primary osteoarthritis of knee: Secondary | ICD-10-CM

## 2018-12-14 DIAGNOSIS — M25461 Effusion, right knee: Secondary | ICD-10-CM | POA: Diagnosis not present

## 2018-12-14 DIAGNOSIS — M1711 Unilateral primary osteoarthritis, right knee: Secondary | ICD-10-CM | POA: Diagnosis not present

## 2018-12-16 ENCOUNTER — Encounter: Payer: Self-pay | Admitting: Maternal Newborn

## 2018-12-16 ENCOUNTER — Other Ambulatory Visit: Payer: Self-pay

## 2018-12-16 ENCOUNTER — Other Ambulatory Visit (HOSPITAL_COMMUNITY)
Admission: RE | Admit: 2018-12-16 | Discharge: 2018-12-16 | Disposition: A | Discharge status: Home / Self Care | Payer: BC Managed Care – PPO | Source: Ambulatory Visit | Attending: Maternal Newborn | Admitting: Maternal Newborn

## 2018-12-16 ENCOUNTER — Ambulatory Visit (INDEPENDENT_AMBULATORY_CARE_PROVIDER_SITE_OTHER): Payer: BC Managed Care – PPO | Admitting: Maternal Newborn

## 2018-12-16 VITALS — BP 120/80 | Ht 64.0 in | Wt 180.0 lb

## 2018-12-16 DIAGNOSIS — R102 Pelvic and perineal pain: Secondary | ICD-10-CM | POA: Diagnosis not present

## 2018-12-16 DIAGNOSIS — B379 Candidiasis, unspecified: Secondary | ICD-10-CM

## 2018-12-16 DIAGNOSIS — N898 Other specified noninflammatory disorders of vagina: Secondary | ICD-10-CM | POA: Insufficient documentation

## 2018-12-16 MED ORDER — FLUCONAZOLE 150 MG PO TABS: 150.0000 mg | ORAL_TABLET | Freq: Once | ORAL | 0 refills | Status: AC

## 2018-12-16 NOTE — Progress Notes (Signed)
Obstetrics & Gynecology Office Visit   Chief Complaint:  Chief Complaint  Patient presents with  . Gynecologic Exam    cramps, vaginal odor    History of Present Illness: Sandra Chandler has had some mild intermittent cramping, located midline in her lower abdominal/pelvic area. This symptom began about three months ago. Episodes usually last from one to three days for a couple of hours. She has tried Tylenol and ibuprofen to relieve the cramping without any changes in the pain. She is postmenopausal and is not having any vaginal bleeding. She is not currently sexually active. She has recently noticed a vaginal odor. She has not noticed any vaginal discharge or vaginal/vulvar irritation. She declines STI testing because she has not been sexually active for a long period of time and had a negative screen last year.   Review of Systems: Review of systems negative unless otherwise noted in HPI  Past Medical History:  Past Medical History:  Diagnosis Date  . Chondromalacia of knee   . Depressed bipolar I disorder ([place])   . Herpes genitalis in women   . Hx of basal cell carcinoma   . Insomnia   . Patellofemoral stress syndrome   . Thyroid disease   . Vitamin D deficiency     Past Surgical History:  Past Surgical History:  Procedure Laterality Date  . AUGMENTATION MAMMAPLASTY Bilateral    age 52 redone @ 5 years ago  . BILATERAL OOPHORECTOMY  05/2011  . BREAST SURGERY  1984  . CESAREAN SECTION     x2  . MOHS SURGERY  01/28/2013  . ROTATOR CUFF REPAIR Right 10/12/2018   Dr. Katha Hamming  . TUBAL LIGATION      Gynecologic History: No LMP recorded. Patient is postmenopausal.  Obstetric History: [id number]  Family History:  Family History  Problem Relation Age of Onset  . Osteoporosis Mother   . Bipolar disorder Father   . Cancer Father        Lung  . Bipolar disorder Brother   . Breast cancer Maternal Grandmother 60  . Melanoma Paternal Grandmother     Social History:   Social History   Socioeconomic History  . Marital status: Divorced    Spouse name: Not on file  . Number of children: 2  . Years of education: Not on file  . Highest education level: 12th grade  Occupational History  . Occupation: Geologist, engineering     Comment: [employer]  Social Needs  . Financial resource strain: Not hard at all  . Food insecurity    Worry: Never true    Inability: Never true  . Transportation needs    Medical: No    Non-medical: No  Tobacco Use  . Smoking status: Never Smoker  . Smokeless tobacco: Never Used  Substance and Sexual Activity  . Alcohol use: Yes    Alcohol/week: 3.0 standard drinks    Types: 2 Cans of beer, 1 Glasses of wine per week  . Drug use: No  . Sexual activity: Not Currently    Partners: Male    Comment: dating, but boyfriend ED  Lifestyle  . Physical activity    Days per week: 0 days    Minutes per session: 0 min  . Stress: Only a little  Relationships  . Social connections    Talks on phone: More than three times a week    Gets together: Twice a week    Attends religious service: Never    Active member of club or  organization: No    Attends meetings of clubs or organizations: Never    Relationship status: Divorced  . Intimate partner violence    Fear of current or ex partner: No    Emotionally abused: No    Physically abused: No    Forced sexual activity: No  Other Topics Concern  . Not on file  Social History Narrative   She lives alone, but sometimes stays with boyfriend.  Divorced, has two grown children.     Allergies:  No Known Allergies  Medications: Prior to Admission medications   Medication Sig Start Date End Date Taking? Authorizing Provider  levothyroxine (SYNTHROID) 50 MCG tablet TAKE ONE TABLET EVERY DAY 09/20/18  Yes Sowles, Drue Stager, MD  ARIPiprazole (ABILIFY) 2 MG tablet Take 1 tablet (2 mg total) by mouth every evening. Patient not taking: Reported on 12/16/2018 11/22/18   Steele Sizer, MD   cyclobenzaprine (FLEXERIL) 5 MG tablet  10/12/18   [provider]  oxyCODONE (OXY IR/ROXICODONE) 5 MG immediate release tablet  10/12/18   [provider]  valACYclovir (VALTREX) 500 MG tablet Take 1 tablet (500 mg total) by mouth daily. And three times daily prn outbreak Patient not taking: Reported on 10/24/2018 07/07/18   Steele Sizer, MD    Physical Exam Vitals:  Vitals:   12/16/18 0903  BP: 120/80   No LMP recorded. Patient is postmenopausal.  General: NAD HEENT: normocephalic, anicteric Pulmonary: No increased work of breathing Genitourinary:  External: Mild atrophy present.  Normal urethral  meatus, normal Bartholin's and Skene's glands.    Vagina: Normal vaginal mucosa, no evidence of prolapse.    Cervix: Grossly normal in appearance, no bleeding  Uterus: Mildly enlarged, mobile, normal contour.  No CMT Extremities: no edema, erythema, or tenderness Neurologic: Grossly intact Psychiatric: mood appropriate, affect full  Wet prep positive for yeast, negative for clue cells, negative whiff, pH 4.5, negative trichomonads  Assessment: 58 y.o. [id number] with pelvic pain, vaginal odor, yeast infection  Plan: Problem List Items Addressed This Visit    None    Visit Diagnoses    Pelvic pain    -  Primary   Relevant Orders   US PELVIS TRANSVAGINAL NON-OB (TV ONLY)   Yeast infection       Relevant Medications   fluconazole (DIFLUCAN) 150 MG tablet   Vaginal odor       Relevant Orders   Cervicovaginal ancillary only     1) Diflucan Rx for yeast infection. 2) Aptima sent to confirm no bacterial vaginosis 3) Ultrasound scheduled due to three month history of pelvic pain, uterine enlargement on exam.  Avel Sensor, CNM 12/16/2018  10:10 AM

## 2018-12-20 LAB — CERVICOVAGINAL ANCILLARY ONLY
Bacterial Vaginitis (gardnerella): NEGATIVE
Molecular Disclaimer: NEGATIVE

## 2018-12-26 ENCOUNTER — Other Ambulatory Visit: Payer: Self-pay

## 2018-12-26 ENCOUNTER — Ambulatory Visit (INDEPENDENT_AMBULATORY_CARE_PROVIDER_SITE_OTHER): Payer: BC Managed Care – PPO | Admitting: Obstetrics and Gynecology

## 2018-12-26 ENCOUNTER — Ambulatory Visit (INDEPENDENT_AMBULATORY_CARE_PROVIDER_SITE_OTHER): Payer: BC Managed Care – PPO

## 2018-12-26 ENCOUNTER — Encounter: Payer: Self-pay | Admitting: Obstetrics and Gynecology

## 2018-12-26 VITALS — BP 130/80 | Ht 63.0 in | Wt 176.0 lb

## 2018-12-26 DIAGNOSIS — R102 Pelvic and perineal pain: Secondary | ICD-10-CM

## 2018-12-27 ENCOUNTER — Encounter: Payer: Self-pay | Admitting: Obstetrics and Gynecology

## 2018-12-27 NOTE — Progress Notes (Signed)
Patient ID: Sandra Chandler, female   DOB: 02-22-1961, 58 y.o.   MRN: [id number]  Reason for Consult: Follow-up (U/S follow up )   Referred by Steele Sizer, MD  Subjective:     HPI:  Sandra Chandler is a 59 y.o. female She is following up today for pelvic pain and cramping for a pelvic US.   Past Medical History:  Diagnosis Date  . Chondromalacia of knee   . Depressed bipolar I disorder ([place])   . Herpes genitalis in women   . Hx of basal cell carcinoma   . Insomnia   . Patellofemoral stress syndrome   . Thyroid disease   . Vitamin D deficiency    Family History  Problem Relation Age of Onset  . Osteoporosis Mother   . Bipolar disorder Father   . Cancer Father        Lung  . Bipolar disorder Brother   . Breast cancer Maternal Grandmother 60  . Melanoma Paternal Grandmother    Past Surgical History:  Procedure Laterality Date  . AUGMENTATION MAMMAPLASTY Bilateral    age 85 redone @ 5 years ago  . BILATERAL OOPHORECTOMY  05/2011  . BREAST SURGERY  1984  . CESAREAN SECTION     x2  . MOHS SURGERY  01/28/2013  . ROTATOR CUFF REPAIR Right 10/12/2018   Dr. Katha Hamming  . TUBAL LIGATION      Short Social History:  Social History   Tobacco Use  . Smoking status: Never Smoker  . Smokeless tobacco: Never Used  Substance Use Topics  . Alcohol use: Yes    Alcohol/week: 3.0 standard drinks    Types: 2 Cans of beer, 1 Glasses of wine per week    No Known Allergies  Current Outpatient Medications  Medication Sig Dispense Refill  . levothyroxine (SYNTHROID) 50 MCG tablet TAKE ONE TABLET EVERY DAY 90 tablet 0  . valACYclovir (VALTREX) 500 MG tablet Take 1 tablet (500 mg total) by mouth daily. And three times daily prn outbreak 35 tablet 1   No current facility-administered medications for this visit.     Review of Systems  Constitutional: Negative for chills, fatigue, fever and unexpected weight change.  HENT: Negative for trouble swallowing.  Eyes:  Negative for loss of vision.  Respiratory: Negative for cough, shortness of breath and wheezing.  Cardiovascular: Negative for chest pain, leg swelling, palpitations and syncope.  GI: Negative for abdominal pain, blood in stool, diarrhea, nausea and vomiting.  GU: Negative for difficulty urinating, dysuria, frequency and hematuria.  Musculoskeletal: Negative for back pain, leg pain and joint pain.  Skin: Negative for rash.  Neurological: Negative for dizziness, headaches, light-headedness, numbness and seizures.  Psychiatric: Negative for behavioral problem, confusion, depressed mood and sleep disturbance.        Objective:  Objective   Vitals:   12/26/18 1646  BP: 130/80  Weight: 176 lb (79.8 kg)  Height: 5\' 3"  (1.6 m)   Body mass index is 31.18 kg/m.  Physical Exam Vitals signs and nursing note reviewed.  Constitutional:      Appearance: She is well-developed.  HENT:     Head: Normocephalic and atraumatic.  Eyes:     Pupils: Pupils are equal, round, and reactive to light.  Cardiovascular:     Rate and Rhythm: Normal rate and regular rhythm.  Pulmonary:     Effort: Pulmonary effort is normal. No respiratory distress.  Skin:    General: Skin is warm and dry.  Neurological:  Mental Status: She is alert and oriented to person, place, and time.  Psychiatric:        Behavior: Behavior normal.        Thought Content: Thought content normal.        Judgment: Judgment normal.         Assessment/Plan:     58 yo with pelvic pain Reviewed the normal [place] results with the patient. She was happy that there was not a concern. Discussed othter etiologies of pevlic pain including a bowel or bladder source. She has not previously had a colonoscopy. She was offered referral today but declined, "I'll do that at my annual." She was also offered a cologuard test. She declined that today but took home a brochure.   More than 14 minutes were spent face to face with the patient in the  room with more than 50% of the time spent providing counseling and discussing the plan of management.    Adrian Prows MD [facility] 12/27/2018 4:56 PM

## 2018-12-28 DIAGNOSIS — M25561 Pain in right knee: Secondary | ICD-10-CM | POA: Diagnosis not present

## 2019-02-05 DIAGNOSIS — Z20828 Contact with and (suspected) exposure to other viral communicable diseases: Secondary | ICD-10-CM | POA: Diagnosis not present

## 2019-02-11 DIAGNOSIS — J019 Acute sinusitis, unspecified: Secondary | ICD-10-CM | POA: Diagnosis not present

## 2019-02-11 DIAGNOSIS — R05 Cough: Secondary | ICD-10-CM | POA: Diagnosis not present

## 2019-02-11 DIAGNOSIS — B9689 Other specified bacterial agents as the cause of diseases classified elsewhere: Secondary | ICD-10-CM | POA: Diagnosis not present

## 2019-02-15 DIAGNOSIS — Z20828 Contact with and (suspected) exposure to other viral communicable diseases: Secondary | ICD-10-CM | POA: Diagnosis not present

## 2019-05-02 ENCOUNTER — Telehealth: Payer: Self-pay | Admitting: Family Medicine

## 2019-05-02 DIAGNOSIS — E039 Hypothyroidism, unspecified: Secondary | ICD-10-CM

## 2019-05-03 NOTE — Telephone Encounter (Signed)
Physical appointment scheduled for August and patient informed that prescription has been sent to pharmacy

## 2019-05-22 DIAGNOSIS — H524 Presbyopia: Secondary | ICD-10-CM | POA: Diagnosis not present

## 2019-05-22 DIAGNOSIS — H2513 Age-related nuclear cataract, bilateral: Secondary | ICD-10-CM | POA: Diagnosis not present

## 2019-05-22 DIAGNOSIS — H52223 Regular astigmatism, bilateral: Secondary | ICD-10-CM | POA: Diagnosis not present

## 2019-05-22 DIAGNOSIS — H5203 Hypermetropia, bilateral: Secondary | ICD-10-CM | POA: Diagnosis not present

## 2019-05-26 ENCOUNTER — Encounter: Payer: Self-pay | Admitting: Family Medicine

## 2019-05-28 ENCOUNTER — Other Ambulatory Visit: Payer: Self-pay | Admitting: Family Medicine

## 2019-05-28 DIAGNOSIS — E039 Hypothyroidism, unspecified: Secondary | ICD-10-CM

## 2019-06-02 ENCOUNTER — Encounter: Payer: Self-pay | Admitting: Family Medicine

## 2019-08-21 DIAGNOSIS — J209 Acute bronchitis, unspecified: Secondary | ICD-10-CM | POA: Diagnosis not present

## 2019-08-21 DIAGNOSIS — J302 Other seasonal allergic rhinitis: Secondary | ICD-10-CM | POA: Diagnosis not present

## 2019-08-21 DIAGNOSIS — B9689 Other specified bacterial agents as the cause of diseases classified elsewhere: Secondary | ICD-10-CM | POA: Diagnosis not present

## 2019-08-21 DIAGNOSIS — J019 Acute sinusitis, unspecified: Secondary | ICD-10-CM | POA: Diagnosis not present

## 2019-09-22 ENCOUNTER — Encounter: Payer: Self-pay | Admitting: Family Medicine

## 2019-10-04 ENCOUNTER — Other Ambulatory Visit: Payer: Self-pay | Admitting: Family Medicine

## 2019-10-04 MED ORDER — HYDROXYZINE HCL 10 MG PO TABS: 10.0000 mg | ORAL_TABLET | Freq: Three times a day (TID) | ORAL | 0 refills | Status: DC | PRN

## 2019-10-30 ENCOUNTER — Other Ambulatory Visit: Payer: Self-pay

## 2019-10-30 ENCOUNTER — Ambulatory Visit (INDEPENDENT_AMBULATORY_CARE_PROVIDER_SITE_OTHER): Payer: BC Managed Care – PPO | Admitting: Family Medicine

## 2019-10-30 ENCOUNTER — Encounter: Payer: Self-pay | Admitting: Family Medicine

## 2019-10-30 VITALS — BP 120/84 | HR 74 | Temp 98.1°F | Resp 14 | Ht 63.0 in | Wt 160.1 lb

## 2019-10-30 DIAGNOSIS — Z23 Encounter for immunization: Secondary | ICD-10-CM

## 2019-10-30 DIAGNOSIS — A6000 Herpesviral infection of urogenital system, unspecified: Secondary | ICD-10-CM

## 2019-10-30 DIAGNOSIS — E785 Hyperlipidemia, unspecified: Secondary | ICD-10-CM

## 2019-10-30 DIAGNOSIS — E039 Hypothyroidism, unspecified: Secondary | ICD-10-CM

## 2019-10-30 DIAGNOSIS — E559 Vitamin D deficiency, unspecified: Secondary | ICD-10-CM | POA: Diagnosis not present

## 2019-10-30 DIAGNOSIS — J302 Other seasonal allergic rhinitis: Secondary | ICD-10-CM

## 2019-10-30 DIAGNOSIS — Z113 Encounter for screening for infections with a predominantly sexual mode of transmission: Secondary | ICD-10-CM

## 2019-10-30 DIAGNOSIS — Z131 Encounter for screening for diabetes mellitus: Secondary | ICD-10-CM | POA: Diagnosis not present

## 2019-10-30 DIAGNOSIS — Z0000 Encounter for general adult medical examination without abnormal findings: Secondary | ICD-10-CM | POA: Diagnosis not present

## 2019-10-30 DIAGNOSIS — Z124 Encounter for screening for malignant neoplasm of cervix: Secondary | ICD-10-CM

## 2019-10-30 DIAGNOSIS — F39 Unspecified mood [affective] disorder: Secondary | ICD-10-CM

## 2019-10-30 DIAGNOSIS — J4520 Mild intermittent asthma, uncomplicated: Secondary | ICD-10-CM

## 2019-10-30 DIAGNOSIS — B001 Herpesviral vesicular dermatitis: Secondary | ICD-10-CM

## 2019-10-30 DIAGNOSIS — Z1231 Encounter for screening mammogram for malignant neoplasm of breast: Secondary | ICD-10-CM

## 2019-10-30 DIAGNOSIS — G4700 Insomnia, unspecified: Secondary | ICD-10-CM

## 2019-10-30 MED ORDER — TEMAZEPAM 15 MG PO CAPS: 15.0000 mg | ORAL_CAPSULE | Freq: Every evening | ORAL | 0 refills | Status: DC | PRN

## 2019-10-30 MED ORDER — VALACYCLOVIR HCL 500 MG PO TABS: 500.0000 mg | ORAL_TABLET | Freq: Every day | ORAL | 1 refills | Status: DC

## 2019-10-30 MED ORDER — FLUTICASONE PROPIONATE 50 MCG/ACT NA SUSP: 2.0000 | Freq: Every day | NASAL | 5 refills | Status: DC

## 2019-10-30 NOTE — Patient Instructions (Signed)

## 2019-10-30 NOTE — Progress Notes (Signed)
Name: Sandra Chandler   MRN: [id number]    DOB: 1960/12/02   Date:10/30/2019       Progress Note  Subjective  Chief Complaint  Chief Complaint  Patient presents with  . Annual Exam    HPI  Patient presents for annual CPE and follow up  Hypothyroidism: taking supplements,she had  gained 21 lbs from 2019 until 09/2018 but weight is now down to 160 lbs , feeling better, still has hair thinning but stable, dry skin but also stable, no change in bowel movements. No dysphagia.   Obesity: she was 182 lbs one year ago, she is down to 160 lbs , she states she has been back to gym since they opened back up and is feeling better.   Mood disorder : history of bipolar, not on medications, still has insomnia, currently not on medication , stopped because she felt it was causing weight gain - she asked about Ambien but afraid of long term side effects, willing to try Temazepam, discussed controlled medication, cannot share   AR/RAD: doing well at this time, uses inhaler and flonase prn only. No cough, wheezing or sob  Diet: balanced diet, but goes up and down at times.  Exercise: she has been physically active, doing well   USPSTF grade A and B recommendations    Office Visit from 10/30/2019 in [facility]  AUDIT-C Score 0     Depression: Phq 9 is  negative Depression screen [facility] 2/9 10/30/2019 10/24/2018 07/07/2018 06/22/2018 02/09/2018  Decreased Interest 0 1 0 1 0  Down, Depressed, Hopeless 0 1 0 2 0  PHQ - 2 Score 0 2 0 3 0  Altered sleeping 1 1 0 3 0  Tired, decreased energy 1 1 0 3 1  Change in appetite 0 0 0 3 0  Feeling bad or failure about yourself  0 0 0 0 0  Trouble concentrating 1 1 0 2 0  Moving slowly or fidgety/restless 1 0 0 1 0  Suicidal thoughts 0 0 - 0 0  PHQ-9 Score 4 5 0 15 1  Difficult doing work/chores Somewhat difficult Somewhat difficult - Very difficult Not difficult at all   Hypertension: BP Readings from Last 3 Encounters:  10/30/19 120/84   12/26/18 130/80  12/16/18 120/80   Obesity: Wt Readings from Last 3 Encounters:  10/30/19 160 lb 1.6 oz (72.6 kg)  12/26/18 176 lb (79.8 kg)  12/16/18 180 lb (81.6 kg)   BMI Readings from Last 3 Encounters:  10/30/19 28.36 kg/m  12/26/18 31.18 kg/m  12/16/18 30.90 kg/m     Hep C Screening: 2019  STD testing and prevention (HIV/chl/gon/syphilis): not interested  Intimate partner violence: negative screen  Sexual History (Partners/Practices/Protection from [employer] hx STI/Pregnancy Plans): broke up from long term relationship, but states they were not sexually active  Pain during Intercourse: not currently sexually active Menstrual History/LMP/Abnormal Bleeding: s/p menopause, discussed post-menopausal bleeding Incontinence Symptoms: no problems.   Breast cancer:  - Last Mammogram: 09/2018  - BRCA gene screening: N/A  Osteoporosis: Discussed high calcium and vitamin D supplementation, weight bearing exercises  Cervical cancer screening: today   Skin cancer: Discussed monitoring for atypical lesions  Colorectal cancer: repeat in 2022  Lung cancer:   Low Dose CT Chest recommended if Age 8-80 years, 30 pack-year currently smoking OR have quit w/in 15years. Patient does not qualify.   ECG: 2016   Advanced Care Planning: A voluntary discussion about advance care planning including the explanation  and discussion of advance directives.  Discussed health care proxy and Living will, and the patient was able to identify a health care proxy as mother - Dyanne Carrel .  Patient does not have a living will at present time.  Lipids: Lab Results  Component Value Date   CHOL 249 (H) 10/24/2018   CHOL 235 (H) 02/01/2017   CHOL 220 (H) 12/03/2015   Lab Results  Component Value Date   HDL 61 10/24/2018   HDL 81 02/01/2017   HDL 75 12/03/2015   Lab Results  Component Value Date   LDLCALC 168 (H) 10/24/2018   LDLCALC 138 (H) 02/01/2017   LDLCALC 130 (H) 12/03/2015   Lab  Results  Component Value Date   TRIG 93 10/24/2018   TRIG 72 02/01/2017   TRIG 73 12/03/2015   Lab Results  Component Value Date   CHOLHDL 4.1 10/24/2018   CHOLHDL 2.9 02/01/2017   CHOLHDL 2.9 12/03/2015   No results found for: LDLDIRECT  Glucose: Glucose  Date Value Ref Range Status  06/03/2011 85 65 - 99 mg/dL Final   Glucose, Bld  Date Value Ref Range Status  10/24/2018 89 65 - 99 mg/dL Final    Comment:    .            Fasting reference interval .   02/09/2018 90 65 - 139 mg/dL Final    Comment:    .        Non-fasting reference interval .   02/01/2017 86 65 - 99 mg/dL Final    Comment:    .            Fasting reference interval .     Patient Active Problem List   Diagnosis Date Noted  . Chondromalacia patellae 05/27/2017  . Bipolar affective disorder, most recent episode unspecified type, remission status unspecified 03/20/2015  . Major depressive disorder, recurrent, in full remission with anxious distress ([place]) 11/29/2014  . Genital herpes 11/29/2014  . Adult hypothyroidism 11/29/2014  . Menopause 11/29/2014  . Patella-femoral syndrome 11/29/2014  . Overweight 11/29/2014  . Pain in joint involving lower leg 11/29/2014  . Personal history of other malignant neoplasm of skin 11/29/2014  . History of basal cell carcinoma 01/10/2013  . Basal cell carcinoma of skin of lip 01/10/2013  . Vitamin D deficiency 09/18/2009    Past Surgical History:  Procedure Laterality Date  . AUGMENTATION MAMMAPLASTY Bilateral    age 57 redone @ 5 years ago  . BILATERAL OOPHORECTOMY  05/2011  . BREAST SURGERY  1984  . CESAREAN SECTION     x2  . MOHS SURGERY  01/28/2013  . ROTATOR CUFF REPAIR Right 10/12/2018   Dr. Katha Hamming  . TUBAL LIGATION      Family History  Problem Relation Age of Onset  . Osteoporosis Mother   . Bipolar disorder Father   . Cancer Father        Lung  . Bipolar disorder Brother   . Breast cancer Maternal Grandmother 60  .  Melanoma Paternal Grandmother     Social History   Socioeconomic History  . Marital status: Divorced    Spouse name: Not on file  . Number of children: 2  . Years of education: Not on file  . Highest education level: 12th grade  Occupational History  . Occupation: purchasing     Comment: [employer]  Tobacco Use  . Smoking status: Never Smoker  . Smokeless tobacco: Never Used  Vaping Use  .  Vaping Use: Never used  Substance and Sexual Activity  . Alcohol use: Yes    Alcohol/week: 3.0 standard drinks    Types: 2 Cans of beer, 1 Glasses of wine per week  . Drug use: No  . Sexual activity: Not Currently    Partners: Male    Comment: dating, but boyfriend ED  Other Topics Concern  . Not on file  Social History Narrative   Son lives with her. .  Divorced, has two grown children.    Social Determinants of Health   Financial Resource Strain: Low Risk   . Difficulty of Paying Living Expenses: Not hard at all  Food Insecurity: No Food Insecurity  . Worried About Charity fundraiser in the Last Year: Never true  . Ran Out of Food in the Last Year: Never true  Transportation Needs: No Transportation Needs  . Lack of Transportation (Medical): No  . Lack of Transportation (Non-Medical): No  Physical Activity: Sufficiently Active  . Days of Exercise per Week: 4 days  . Minutes of Exercise per Session: 60 min  Stress: Stress Concern Present  . Feeling of Stress : Very much  Social Connections: Moderately Integrated  . Frequency of Communication with Friends and Family: More than three times a week  . Frequency of Social Gatherings with Friends and Family: More than three times a week  . Attends Religious Services: More than 4 times per year  . Active Member of Clubs or Organizations: Yes  . Attends Archivist Meetings: More than 4 times per year  . Marital Status: Divorced  Human resources officer Violence: Not At Risk  . Fear of Current or Ex-Partner: No  . Emotionally  Abused: No  . Physically Abused: No  . Sexually Abused: No     Current Outpatient Medications:  .  fluticasone (FLONASE) 50 MCG/ACT nasal spray, Place 2 sprays into both nostrils daily., Disp: 16 g, Rfl: 5 .  levothyroxine (SYNTHROID) 50 MCG tablet, TAKE 1 TABLET BY MOUTH DAILY, Disp: 90 tablet, Rfl: 1 .  valACYclovir (VALTREX) 500 MG tablet, Take 1 tablet (500 mg total) by mouth daily. And three times daily prn outbreak, Disp: 35 tablet, Rfl: 1 .  albuterol (VENTOLIN HFA) 108 (90 Base) MCG/ACT inhaler, Inhale into the lungs. (Patient not taking: Reported on 10/30/2019), Disp: , Rfl:  .  temazepam (RESTORIL) 15 MG capsule, Take 1 capsule (15 mg total) by mouth at bedtime as needed for sleep., Disp: 30 capsule, Rfl: 0  No Known Allergies   ROS  Constitutional: Negative for fever, positive for  weight change.  Respiratory: Negative for cough and shortness of breath.   Cardiovascular: Negative for chest pain or palpitations.  Gastrointestinal: Negative for abdominal pain, no bowel changes.  Musculoskeletal: Negative for gait problem or joint swelling.  Skin: Negative for rash.  Neurological: Negative for dizziness or headache.  No other specific complaints in a complete review of systems (except as listed in HPI above).  Objective  Vitals:   10/30/19 0921  BP: 120/84  Pulse: 74  Resp: 14  Temp: 98.1 F (36.7 C)  TempSrc: Temporal  SpO2: 99%  Weight: 160 lb 1.6 oz (72.6 kg)  Height: '5\' 3"'  (1.6 m)    Body mass index is 28.36 kg/m.  Physical Exam  Constitutional: Patient appears well-developed and well-nourished. No distress.  HENT: Head: Normocephalic and atraumatic. Ears: B TMs ok, no erythema or effusion; Nose: Nose normal. Mouth/Throat: not done  Eyes: Conjunctivae and EOM are normal.  Pupils are equal, round, and reactive to light. No scleral icterus.  Neck: Normal range of motion. Neck supple. No JVD present. No thyromegaly present.  Cardiovascular: Normal rate,  regular rhythm and normal heart sounds.  No murmur heard. No BLE edema. Pulmonary/Chest: Effort normal and breath sounds normal. No respiratory distress. Abdominal: Soft. Bowel sounds are normal, no distension. There is no tenderness. no masses Breast: no lumps or masses, no nipple discharge or rashes FEMALE GENITALIA:  External genitalia normal External urethra normal Vaginal vault normal without discharge or lesions Cervix normal without discharge or lesions Bimanual exam normal without masses RECTAL: not done  Musculoskeletal: Normal range of motion, no joint effusions. No gross deformities Neurological: he is alert and oriented to person, place, and time. No cranial nerve deficit. Coordination, balance, strength, speech and gait are normal.  Skin: Skin is warm and dry. No rash noted. No erythema.  Psychiatric: Patient has a normal mood and affect. behavior is normal. Judgment and thought content normal.  Fall Risk: Fall Risk  10/30/2019 10/24/2018 07/07/2018 06/22/2018 03/29/2018  Falls in the past year? 0 0 0 0 0  Number falls in past yr: 0 0 0 0 0  Injury with Fall? 0 0 0 0 0     Functional Status Survey: Is the patient deaf or have difficulty hearing?: No Does the patient have difficulty seeing, even when wearing glasses/contacts?: No Does the patient have difficulty concentrating, remembering, or making decisions?: No Does the patient have difficulty walking or climbing stairs?: No Does the patient have difficulty dressing or bathing?: No Does the patient have difficulty doing errands alone such as visiting a doctor's office or shopping?: No   Assessment & Plan  1. Mood disorder ([place])  She refuses medication - CBC with Differential/Platelet - COMPLETE METABOLIC PANEL WITH GFR  2. Well adult exam  She states was not sexually active   3. Adult hypothyroidism  - TSH  4. Dyslipidemia  - Lipid panel  5. Vitamin D deficiency  - VITAMIN D 25 Hydroxy (Vit-D  Deficiency, Fractures)  6. Diabetes mellitus screening  - Hemoglobin A1c  7. Need for Tdap vaccination  - Tdap vaccine greater than or equal to 7yo IM  8. Cervical cancer screening  Repeat next year   9. Routine screening for STI (sexually transmitted infection)  She states not necessary   10. Encounter for screening mammogram for breast cancer  - MM 3D SCREEN BREAST BILATERAL; Future  11. Herpes simplex infection of genitourinary system  - valACYclovir (VALTREX) 500 MG tablet; Take 1 tablet (500 mg total) by mouth daily. And three times daily prn outbreak  Dispense: 35 tablet; Refill: 1  12. Fever blister  - fluticasone (FLONASE) 50 MCG/ACT nasal spray; Place 2 sprays into both nostrils daily.  Dispense: 16 g; Refill: 5 - valACYclovir (VALTREX) 500 MG tablet; Take 1 tablet (500 mg total) by mouth daily. And three times daily prn outbreak  Dispense: 35 tablet; Refill: 1  13. Mild intermittent reactive airway disease without complication  - fluticasone (FLONASE) 50 MCG/ACT nasal spray; Place 2 sprays into both nostrils daily.  Dispense: 16 g; Refill: 5  14. Seasonal allergic rhinitis, unspecified trigger   15. Insomnia, unspecified type  - temazepam (RESTORIL) 15 MG capsule; Take 1 capsule (15 mg total) by mouth at bedtime as needed for sleep.  Dispense: 30 capsule; Refill: 0  -USPSTF grade A and B recommendations reviewed with patient; age-appropriate recommendations, preventive care, screening tests, etc discussed and encouraged; healthy  living encouraged; see AVS for patient education given to patient -Discussed importance of 150 minutes of physical activity weekly, eat two servings of fish weekly, eat one serving of tree nuts ( cashews, pistachios, pecans, almonds.Marland Kitchen) every other day, eat 6 servings of fruit/vegetables daily and drink plenty of water and avoid sweet beverages.

## 2019-10-31 ENCOUNTER — Encounter: Payer: Self-pay | Admitting: Family Medicine

## 2019-10-31 LAB — COMPLETE METABOLIC PANEL WITH GFR
AG Ratio: 1.7 (calc) (ref 1.0–2.5)
ALT: 13 U/L (ref 6–29)
AST: 22 U/L (ref 10–35)
Albumin: 4.8 g/dL (ref 3.6–5.1)
Alkaline phosphatase (APISO): 58 U/L (ref 37–153)
BUN: 10 mg/dL (ref 7–25)
CO2: 26 mmol/L (ref 20–32)
Calcium: 10 mg/dL (ref 8.6–10.4)
Chloride: 104 mmol/L (ref 98–110)
Creat: 0.71 mg/dL (ref 0.50–1.05)
GFR, Est African American: 110 mL/min/{1.73_m2} (ref >=60)
GFR, Est Non African American: 95 mL/min/{1.73_m2} (ref >=60)
Globulin: 2.9 g/dL (calc) (ref 1.9–3.7)
Glucose, Bld: 80 mg/dL (ref 65–99)
Potassium: 3.8 mmol/L (ref 3.5–5.3)
Sodium: 139 mmol/L (ref 135–146)
Total Bilirubin: 0.6 mg/dL (ref 0.2–1.2)
Total Protein: 7.7 g/dL (ref 6.1–8.1)

## 2019-10-31 LAB — LIPID PANEL
Cholesterol: 213 mg/dL — ABNORMAL HIGH (ref <200)
HDL: 78 mg/dL (ref >=50)
LDL Cholesterol (Calc): 117 mg/dL (calc) — ABNORMAL HIGH
Non-HDL Cholesterol (Calc): 135 mg/dL (calc) — ABNORMAL HIGH (ref <130)
Total CHOL/HDL Ratio: 2.7 (calc) (ref <5.0)
Triglycerides: 79 mg/dL (ref <150)

## 2019-10-31 LAB — CBC WITH DIFFERENTIAL/PLATELET
Absolute Monocytes: 590 cells/uL (ref 200–950)
Basophils Absolute: 40 cells/uL (ref 0–200)
Basophils Relative: 0.6 %
Eosinophils Absolute: 80 cells/uL (ref 15–500)
Eosinophils Relative: 1.2 %
HCT: 39.3 % (ref 35.0–45.0)
Hemoglobin: 12.7 g/dL (ref 11.7–15.5)
Lymphs Abs: 2017 cells/uL (ref 850–3900)
MCH: 29.3 pg (ref 27.0–33.0)
MCHC: 32.3 g/dL (ref 32.0–36.0)
MCV: 90.8 fL (ref 80.0–100.0)
MPV: 11.3 fL (ref 7.5–12.5)
Monocytes Relative: 8.8 %
Neutro Abs: 3973 cells/uL (ref 1500–7800)
Neutrophils Relative %: 59.3 %
Platelets: 279 10*3/uL (ref 140–400)
RBC: 4.33 10*6/uL (ref 3.80–5.10)
RDW: 12.7 % (ref 11.0–15.0)
Total Lymphocyte: 30.1 %
WBC: 6.7 10*3/uL (ref 3.8–10.8)

## 2019-10-31 LAB — TSH: TSH: 0.73 mIU/L (ref 0.40–4.50)

## 2019-10-31 LAB — VITAMIN D 25 HYDROXY (VIT D DEFICIENCY, FRACTURES): Vit D, 25-Hydroxy: 22 ng/mL — ABNORMAL LOW (ref 30–100)

## 2019-10-31 LAB — HEMOGLOBIN A1C
Hgb A1c MFr Bld: 5.2 % of total Hgb (ref <5.7)
Mean Plasma Glucose: 103 (calc)
eAG (mmol/L): 5.7 (calc)

## 2019-11-04 ENCOUNTER — Other Ambulatory Visit: Payer: Self-pay | Admitting: Family Medicine

## 2019-11-04 DIAGNOSIS — E039 Hypothyroidism, unspecified: Secondary | ICD-10-CM

## 2019-11-04 MED ORDER — LEVOTHYROXINE SODIUM 50 MCG PO TABS: 50.0000 ug | ORAL_TABLET | Freq: Every day | ORAL | 1 refills | Status: DC

## 2019-12-07 ENCOUNTER — Inpatient Hospital Stay: Admission: RE | Admit: 2019-12-07 | Payer: BC Managed Care – PPO | Source: Ambulatory Visit

## 2019-12-13 ENCOUNTER — Encounter: Payer: Self-pay | Admitting: Internal Medicine

## 2019-12-13 ENCOUNTER — Other Ambulatory Visit: Payer: Self-pay

## 2019-12-13 ENCOUNTER — Telehealth (INDEPENDENT_AMBULATORY_CARE_PROVIDER_SITE_OTHER): Payer: BC Managed Care – PPO | Admitting: Internal Medicine

## 2019-12-13 DIAGNOSIS — J4520 Mild intermittent asthma, uncomplicated: Secondary | ICD-10-CM | POA: Diagnosis not present

## 2019-12-13 DIAGNOSIS — R05 Cough: Secondary | ICD-10-CM

## 2019-12-13 DIAGNOSIS — R059 Cough, unspecified: Secondary | ICD-10-CM

## 2019-12-13 DIAGNOSIS — J40 Bronchitis, not specified as acute or chronic: Secondary | ICD-10-CM | POA: Diagnosis not present

## 2019-12-13 MED ORDER — ALBUTEROL SULFATE HFA 108 (90 BASE) MCG/ACT IN AERS: 1.0000 | INHALATION_SPRAY | RESPIRATORY_TRACT | 1 refills | Status: DC | PRN

## 2019-12-13 MED ORDER — AZITHROMYCIN 250 MG PO TABS: ORAL_TABLET | ORAL | 0 refills | Status: DC

## 2019-12-13 NOTE — Progress Notes (Signed)
Name: Sandra Chandler   MRN: [id number]    DOB: 06-23-1960   Date:12/13/2019       Progress Note  Subjective  Chief Complaint  Chief Complaint  Patient presents with  . Cough    Cough started about a week ago, recently it has gotten deeper and her chest is sore, she states she gets bronchitis 1-2 a year    I connected with  Ignacia Felling on 12/13/19 at  9:00 AM EDT by telephone and verified that I am speaking with the correct person using two identifiers.  I discussed the limitations, risks, security and privacy concerns of performing an evaluation and management service by telephone and the availability of in person appointments. The patient expressed understanding and agreed to proceed. Staff also discussed with the patient that there may be a patient responsible charge related to this service. Patient Location: work [employer]) Provider Location: [facility] Additional Individuals present: none  HPI Patient is a 59 year old female patient of Dr. Ancil Boozer Last visit with her was 10/30/2019 Follows up today via phone visit with cough  She notes the cough started a week ago, small cough, with a little more problematic in the recent past.  Now more barking cough and feels in her chest. Notes has bronchitis in past. Her chest is now sore from coughing. Min production, sometimes thick No marked SOB, noted shorter than normal, feels chest tight No fever, feeling feverish No sore throat.  mild congestion/drainage from sinuses, is more clear No  loss of smell, loss of taste No N/V No muscle aches No marked loose stools/diarrhea No CP,  passing out episodes Not have Covid vaccine, was thinking about getting more recently, and wanted to wait until her symptoms improved.  Strongly encouraged her to get the vaccine when symptoms do improve.  Started albuterol inhaler yesterday and helping, used once yesterday, tried nothing else She has a history of allergic rhinitis/reactive airway disease for  which he uses an inhaler (albuterol) and Flonase as needed Tobacco-never smoker Comorbid conditions reviewed No asthma/COPD hx,  No h/o DM, heart disease, CKD,  + overweight Wt Readings from Last 3 Encounters:  10/30/19 160 lb 1.6 oz (72.6 kg)  12/26/18 176 lb (79.8 kg)  12/16/18 180 lb (81.6 kg)        Patient Active Problem List   Diagnosis Date Noted  . Chondromalacia patellae 05/27/2017  . Bipolar affective disorder, most recent episode unspecified type, remission status unspecified 03/20/2015  . Major depressive disorder, recurrent, in full remission with anxious distress ([place]) 11/29/2014  . Genital herpes 11/29/2014  . Adult hypothyroidism 11/29/2014  . Menopause 11/29/2014  . Patella-femoral syndrome 11/29/2014  . Overweight 11/29/2014  . Pain in joint involving lower leg 11/29/2014  . Personal history of other malignant neoplasm of skin 11/29/2014  . History of basal cell carcinoma 01/10/2013  . Basal cell carcinoma of skin of lip 01/10/2013  . Vitamin D deficiency 09/18/2009    Past Surgical History:  Procedure Laterality Date  . AUGMENTATION MAMMAPLASTY Bilateral    age 48 redone @ 5 years ago  . BILATERAL OOPHORECTOMY  05/2011  . BREAST SURGERY  1984  . CESAREAN SECTION     x2  . MOHS SURGERY  01/28/2013  . ROTATOR CUFF REPAIR Right 10/12/2018   Dr. Katha Hamming  . TUBAL LIGATION      Family History  Problem Relation Age of Onset  . Osteoporosis Mother   . Bipolar disorder Father   . Cancer Father  Lung  . Bipolar disorder Brother   . Breast cancer Maternal Grandmother 60  . Melanoma Paternal Grandmother     Social History   Tobacco Use  . Smoking status: Never Smoker  . Smokeless tobacco: Never Used  Substance Use Topics  . Alcohol use: Yes    Alcohol/week: 3.0 standard drinks    Types: 1 Glasses of wine, 2 Cans of beer per week     Current Outpatient Medications:  .  albuterol (VENTOLIN HFA) 108 (90 Base) MCG/ACT inhaler,  Inhale into the lungs. , Disp: , Rfl:  .  levothyroxine (SYNTHROID) 50 MCG tablet, Take 1 tablet (50 mcg total) by mouth daily., Disp: 90 tablet, Rfl: 1 .  valACYclovir (VALTREX) 500 MG tablet, Take 1 tablet (500 mg total) by mouth daily. And three times daily prn outbreak, Disp: 35 tablet, Rfl: 1 .  fluticasone (FLONASE) 50 MCG/ACT nasal spray, Place 2 sprays into both nostrils daily. (Patient not taking: Reported on 12/13/2019), Disp: 16 g, Rfl: 5  No Known Allergies  With staff assistance, above reviewed with the patient today.  ROS: As per HPI, otherwise no specific complaints on a limited and focused system review   Objective  Virtual encounter, vitals not obtained.  There is no height or weight on file to calculate BMI.  Physical Exam   Appears in NAD via conversation, occasional intermittent cough on the phone conversation noted Breathing: No obvious respiratory distress. Speaking in complete sentences Neurological: Pt is alert, Speech is normal Psychiatric: Patient has a normal mood and affect, behavior is normal. Judgment and thought content normal.   No results found for this or any previous visit (from the past 72 hour(s)).  PHQ2/9: Depression screen [facility] 2/9 12/13/2019 10/30/2019 10/24/2018 07/07/2018 06/22/2018  Decreased Interest 0 0 1 0 1  Down, Depressed, Hopeless 0 0 1 0 2  PHQ - 2 Score 0 0 2 0 3  Altered sleeping - 1 1 0 3  Tired, decreased energy - 1 1 0 3  Change in appetite - 0 0 0 3  Feeling bad or failure about yourself  - 0 0 0 0  Trouble concentrating - 1 1 0 2  Moving slowly or fidgety/restless - 1 0 0 1  Suicidal thoughts - 0 0 - 0  PHQ-9 Score - 4 5 0 15  Difficult doing work/chores - Somewhat difficult Somewhat difficult - Very difficult   PHQ-2/9 Result reviewed  Fall Risk: Fall Risk  12/13/2019 10/30/2019 10/24/2018 07/07/2018 06/22/2018  Falls in the past year? 0 0 0 0 0  Number falls in past yr: 0 0 0 0 0  Injury with Fall? 0 0 0 0 0  Follow up Falls  evaluation completed - - - -     Assessment & Plan  1. Cough 2. Bronchitis 3. Mild intermittent reactive airway disease without complication Patient was quite concerned that this is becoming more in her chest and like bronchitis in the past and that she needed an antibiotic called into be helpful presently.  She notes she has been battling symptoms for over a week now with the cough more problematic.  Also discussed in the setting of a pandemic with increased cases recently, the concern that this could be Covid, especially noting she is unvaccinated.  Has not had fevers, no loss of taste or smell, no bad muscle aches, although still could be Covid as we discussed. Also noted she is at work presently, and noted if she is coughing frequently,  she does need to not be in close contact with others that she can potentially expose, with the importance of cough hygiene noted Agreed to proceed as follows. We will add azithromycin-as directed Recommended increasing her inhaler to use more regularly, 2-3 times a day routinely, and then if symptoms improve, return to just as needed use.  This can help with clearance. Also recommended a Mucinex or Robitussin product as needed to help. Can use a Tylenol type product as needed if develops any low-grade temps or aches. Relative rest also helpful.  Noted if she starts to have increasing symptoms more concerning for Covid as we outlined today, she needs to go get a Covid test, and she was understanding of that.  Also, if her symptoms significantly worsen with higher fevers, shortness of breath increasing, or more concerning symptoms in association, she needs to be seen more urgently as well.  She should follow-up if symptoms not improving or more problematic despite the above.  - azithromycin (ZITHROMAX) 250 MG tablet; Take 2 tablets on day 1, then 1 tablet daily for the next 4 days  Dispense: 6 tablet; Refill: 0 - albuterol (VENTOLIN HFA) 108 (90 Base) MCG/ACT  inhaler; Inhale 1-2 puffs into the lungs every 4 (four) hours as needed for wheezing or shortness of breath. Use 2-3 times daily routinely presently, then return to as needed use as symptoms improve  Dispense: 6.7 g; Refill: 1  Also emphasized that his symptoms are improved, getting the Covid vaccine would be highly recommended.  I discussed the assessment and treatment plan with the patient. The patient was provided an opportunity to ask questions and all were answered. The patient agreed with the plan and demonstrated an understanding of the instructions.   The patient was advised to call back or seek an in-person evaluation if the symptoms worsen or if the condition fails to improve as anticipated.  I provided 20 minutes of non-face-to-face time during this encounter that included discussing at length patient's sx/history, pertinent pmhx, medications, treatment and follow up plan. This time also included the necessary documentation, orders, and chart review.  Towanda Malkin, MD

## 2019-12-18 ENCOUNTER — Encounter: Payer: Self-pay | Admitting: Internal Medicine

## 2019-12-18 ENCOUNTER — Other Ambulatory Visit: Payer: Self-pay | Admitting: Internal Medicine

## 2019-12-18 DIAGNOSIS — R059 Cough, unspecified: Secondary | ICD-10-CM

## 2019-12-18 MED ORDER — PROMETHAZINE-DM 6.25-15 MG/5ML PO SYRP: 5.0000 mL | ORAL_SOLUTION | Freq: Four times a day (QID) | ORAL | 0 refills | Status: DC | PRN

## 2019-12-18 NOTE — Progress Notes (Signed)
Patient requested medication to help with cough. Prescription to pharmacy provided.

## 2019-12-21 ENCOUNTER — Inpatient Hospital Stay: Admission: RE | Admit: 2019-12-21 | Payer: BC Managed Care – PPO | Source: Ambulatory Visit

## 2020-01-08 DIAGNOSIS — D485 Neoplasm of uncertain behavior of skin: Secondary | ICD-10-CM | POA: Diagnosis not present

## 2020-01-08 DIAGNOSIS — X32XXXA Exposure to sunlight, initial encounter: Secondary | ICD-10-CM | POA: Diagnosis not present

## 2020-01-08 DIAGNOSIS — D225 Melanocytic nevi of trunk: Secondary | ICD-10-CM | POA: Diagnosis not present

## 2020-01-08 DIAGNOSIS — Z85828 Personal history of other malignant neoplasm of skin: Secondary | ICD-10-CM | POA: Diagnosis not present

## 2020-01-08 DIAGNOSIS — D2262 Melanocytic nevi of left upper limb, including shoulder: Secondary | ICD-10-CM | POA: Diagnosis not present

## 2020-01-08 DIAGNOSIS — L570 Actinic keratosis: Secondary | ICD-10-CM | POA: Diagnosis not present

## 2020-01-08 DIAGNOSIS — D2261 Melanocytic nevi of right upper limb, including shoulder: Secondary | ICD-10-CM | POA: Diagnosis not present

## 2020-01-08 DIAGNOSIS — D1801 Hemangioma of skin and subcutaneous tissue: Secondary | ICD-10-CM | POA: Diagnosis not present

## 2020-01-16 ENCOUNTER — Other Ambulatory Visit: Payer: Self-pay

## 2020-01-16 ENCOUNTER — Ambulatory Visit
Admission: RE | Admit: 2020-01-16 | Discharge: 2020-01-16 | Disposition: A | Discharge status: Home / Self Care | Payer: BC Managed Care – PPO | Source: Ambulatory Visit | Attending: Family Medicine | Admitting: Family Medicine

## 2020-01-16 DIAGNOSIS — Z1231 Encounter for screening mammogram for malignant neoplasm of breast: Secondary | ICD-10-CM | POA: Insufficient documentation

## 2020-01-22 ENCOUNTER — Other Ambulatory Visit: Payer: Self-pay | Admitting: Family Medicine

## 2020-01-22 DIAGNOSIS — R928 Other abnormal and inconclusive findings on diagnostic imaging of breast: Secondary | ICD-10-CM

## 2020-01-22 DIAGNOSIS — N6310 Unspecified lump in the right breast, unspecified quadrant: Secondary | ICD-10-CM

## 2020-01-23 ENCOUNTER — Ambulatory Visit
Admission: RE | Admit: 2020-01-23 | Discharge: 2020-01-23 | Disposition: A | Discharge status: Home / Self Care | Payer: BC Managed Care – PPO | Source: Ambulatory Visit | Attending: Family Medicine | Admitting: Family Medicine

## 2020-01-23 ENCOUNTER — Other Ambulatory Visit: Payer: Self-pay

## 2020-01-23 DIAGNOSIS — R928 Other abnormal and inconclusive findings on diagnostic imaging of breast: Secondary | ICD-10-CM | POA: Insufficient documentation

## 2020-01-23 DIAGNOSIS — N6310 Unspecified lump in the right breast, unspecified quadrant: Secondary | ICD-10-CM

## 2020-01-23 DIAGNOSIS — N6489 Other specified disorders of breast: Secondary | ICD-10-CM | POA: Diagnosis not present

## 2020-01-24 ENCOUNTER — Encounter: Payer: Self-pay | Admitting: Family Medicine

## 2020-01-24 ENCOUNTER — Other Ambulatory Visit: Payer: Self-pay | Admitting: Family Medicine

## 2020-01-24 ENCOUNTER — Ambulatory Visit: Payer: BC Managed Care – PPO

## 2020-01-24 DIAGNOSIS — R928 Other abnormal and inconclusive findings on diagnostic imaging of breast: Secondary | ICD-10-CM

## 2020-01-29 ENCOUNTER — Other Ambulatory Visit: Payer: Self-pay

## 2020-01-29 DIAGNOSIS — R928 Other abnormal and inconclusive findings on diagnostic imaging of breast: Secondary | ICD-10-CM

## 2020-01-30 ENCOUNTER — Other Ambulatory Visit: Payer: Self-pay | Admitting: Family Medicine

## 2020-01-31 ENCOUNTER — Encounter: Payer: Self-pay | Admitting: Family Medicine

## 2020-02-07 ENCOUNTER — Other Ambulatory Visit: Payer: Self-pay | Admitting: Family Medicine

## 2020-02-07 DIAGNOSIS — N951 Menopausal and female climacteric states: Secondary | ICD-10-CM

## 2020-02-07 MED ORDER — PREMARIN 0.625 MG/GM VA CREA: 1.0000 | TOPICAL_CREAM | Freq: Every day | VAGINAL | 12 refills | Status: DC

## 2020-02-08 ENCOUNTER — Encounter: Payer: Self-pay | Admitting: Family Medicine

## 2020-02-09 ENCOUNTER — Other Ambulatory Visit: Payer: Self-pay | Admitting: Emergency Medicine

## 2020-02-09 ENCOUNTER — Telehealth: Payer: Self-pay | Admitting: Emergency Medicine

## 2020-02-09 ENCOUNTER — Other Ambulatory Visit: Payer: Self-pay | Admitting: Family Medicine

## 2020-02-09 DIAGNOSIS — N951 Menopausal and female climacteric states: Secondary | ICD-10-CM

## 2020-02-09 DIAGNOSIS — J40 Bronchitis, not specified as acute or chronic: Secondary | ICD-10-CM

## 2020-02-09 DIAGNOSIS — J4520 Mild intermittent asthma, uncomplicated: Secondary | ICD-10-CM

## 2020-02-09 MED ORDER — ESTRADIOL 0.1 MG/GM VA CREA: 1.0000 | TOPICAL_CREAM | VAGINAL | 12 refills | Status: DC

## 2020-02-16 DIAGNOSIS — J019 Acute sinusitis, unspecified: Secondary | ICD-10-CM | POA: Diagnosis not present

## 2020-02-16 DIAGNOSIS — B9689 Other specified bacterial agents as the cause of diseases classified elsewhere: Secondary | ICD-10-CM | POA: Diagnosis not present

## 2020-02-16 DIAGNOSIS — J302 Other seasonal allergic rhinitis: Secondary | ICD-10-CM | POA: Diagnosis not present

## 2020-02-27 DIAGNOSIS — H25041 Posterior subcapsular polar age-related cataract, right eye: Secondary | ICD-10-CM | POA: Diagnosis not present

## 2020-02-27 DIAGNOSIS — H5203 Hypermetropia, bilateral: Secondary | ICD-10-CM | POA: Diagnosis not present

## 2020-03-25 IMAGING — MG DIGITAL SCREENING BILATERAL MAMMOGRAM WITH IMPLANTS, CAD AND TOM
8 of 18 series · 8 of 38 positions shown · non-contrast
Comparison: Previous exam(s).

CLINICAL DATA: Screening. History of breast implant revision. She
was seen in May 2016 after screening mammogram, and extracapsular
silicone was identified in the right breast.

EXAM:
DIGITAL SCREENING BILATERAL MAMMOGRAM WITH IMPLANTS, CAD AND TOMO
The patient has retroglandular implants. Standard and implant
displaced views were performed.

[R CC (1 of 2)]
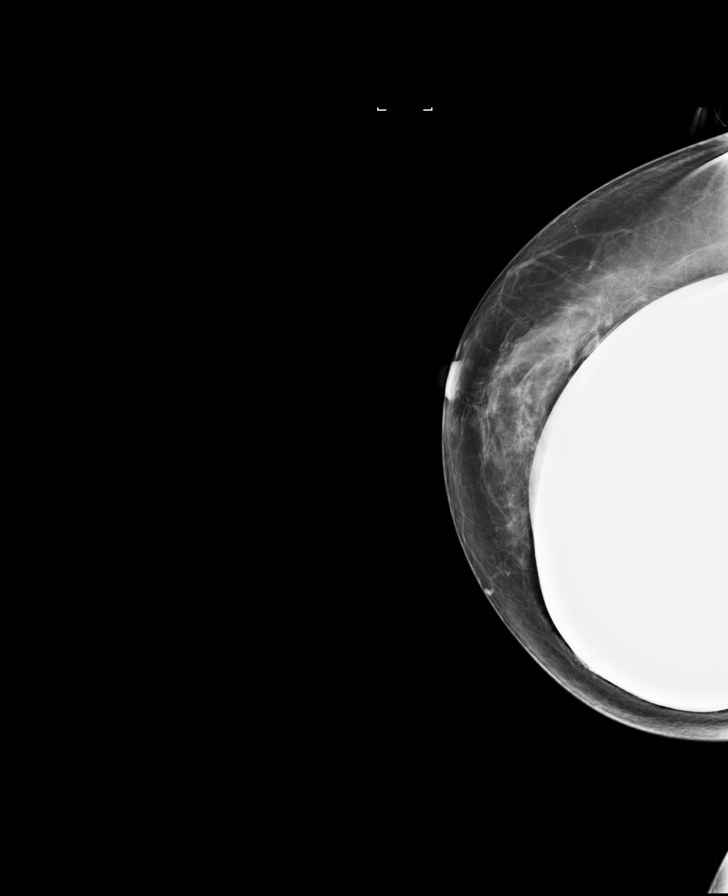

[R MLO]
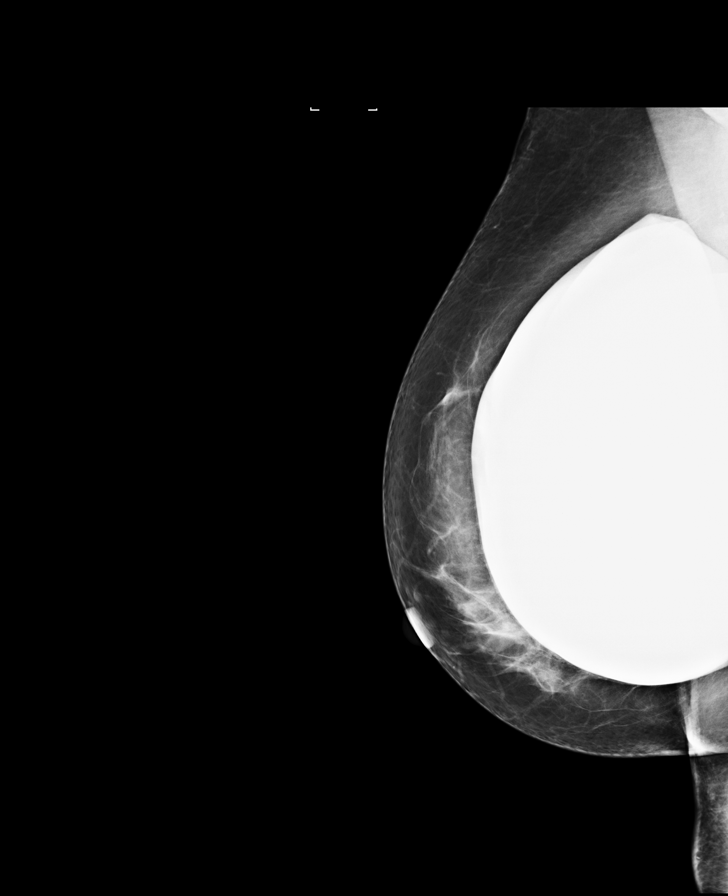

[L MLO (1 of 2)]
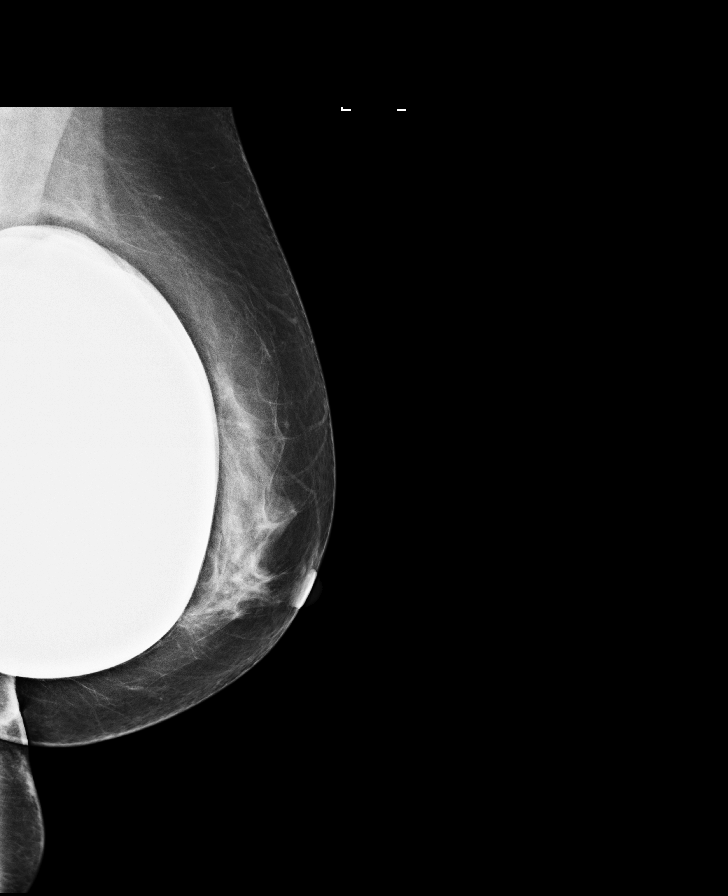

[L CC (1 of 2)]
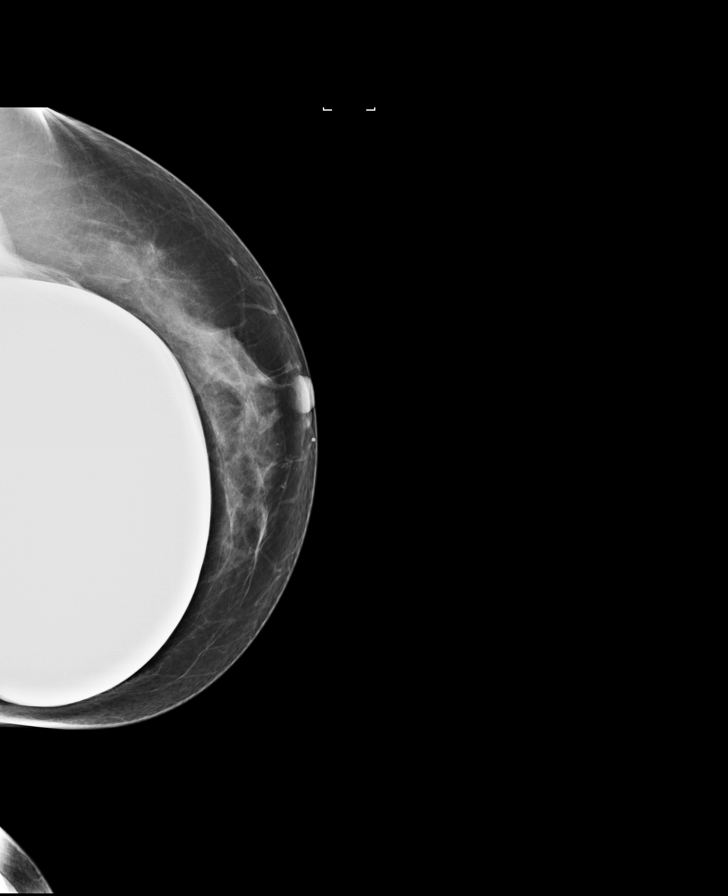

[R CC (2 of 2)]
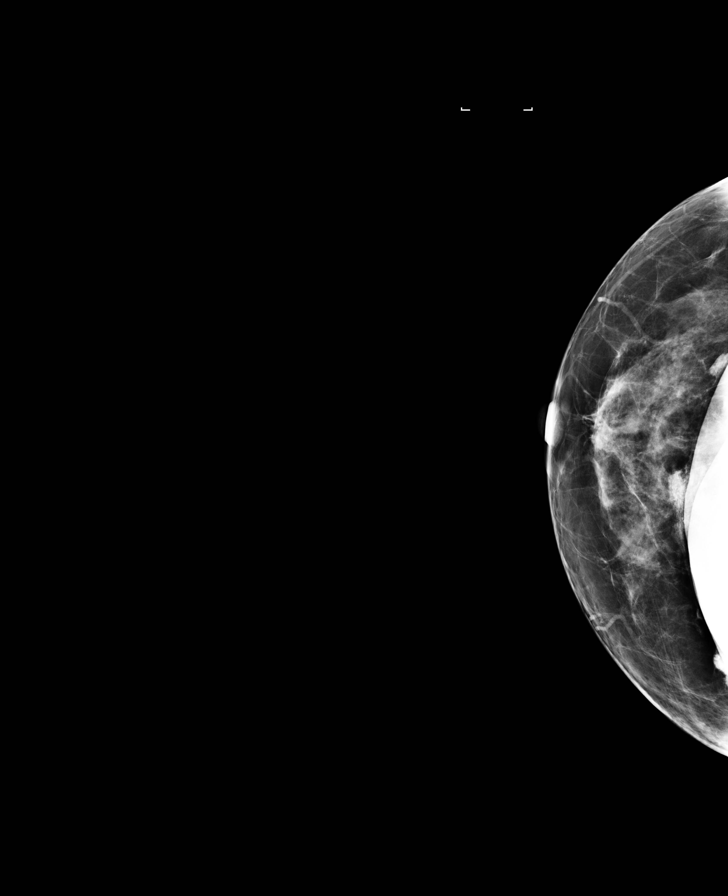

[L MLO (2 of 2)]
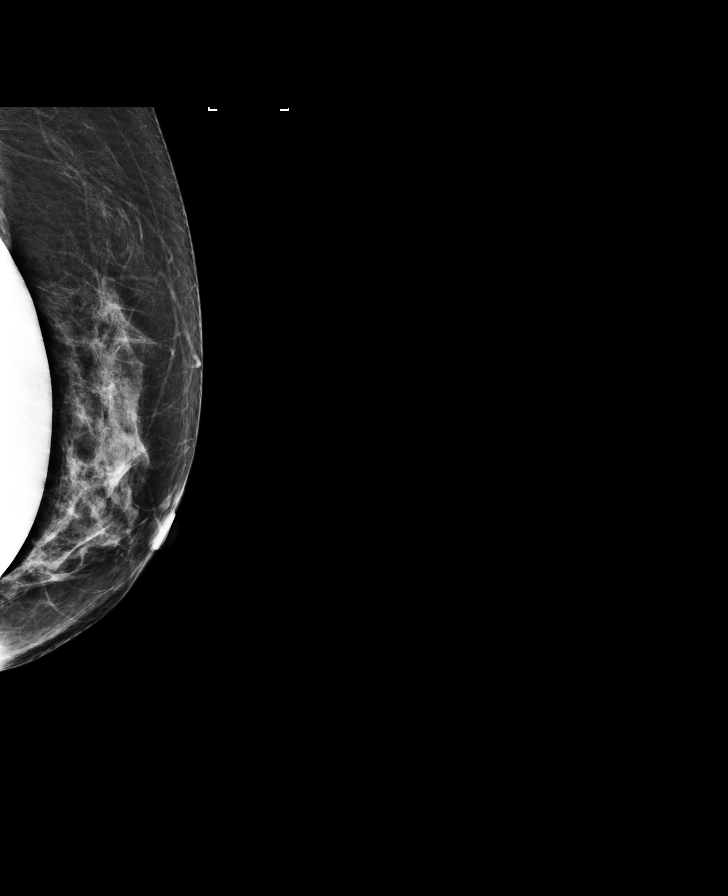

[L CC (2 of 2)]
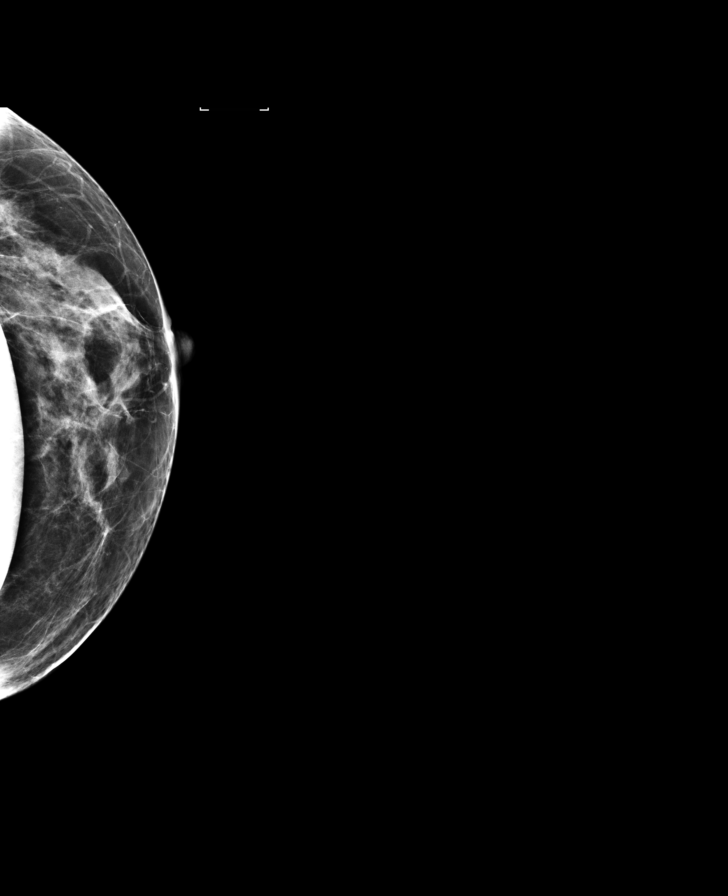

[L MLO synth-2D]
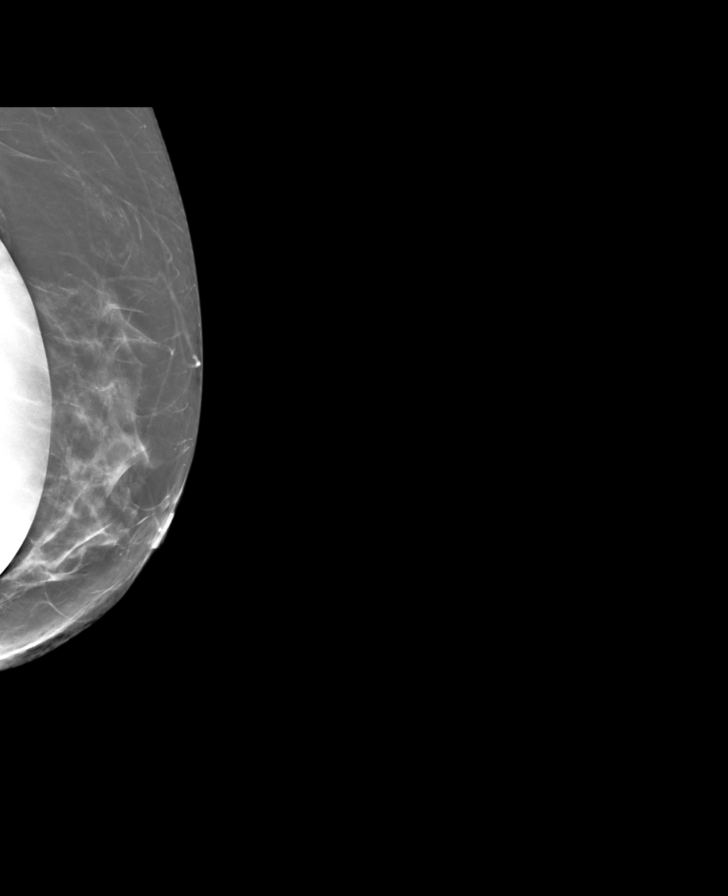

[8 of 38 positions shown; findings below may reference images not displayed]

ACR Breast Density Category c: The breast tissue is heterogeneously
dense, which may obscure small masses.
FINDINGS: There are no findings suspicious for malignancy. Images were
processed with CAD.
IMPRESSION: No mammographic evidence of malignancy. A result letter of this
screening mammogram will be mailed directly to the patient.

RECOMMENDATION:
Screening mammogram in one year. (Code:22-X-LZ8)

BI-RADS CATEGORY  2: Benign

## 2020-04-15 ENCOUNTER — Encounter: Payer: Self-pay | Admitting: Family Medicine

## 2020-04-16 ENCOUNTER — Other Ambulatory Visit: Payer: Self-pay

## 2020-04-16 ENCOUNTER — Ambulatory Visit (INDEPENDENT_AMBULATORY_CARE_PROVIDER_SITE_OTHER): Payer: Self-pay | Admitting: Dermatology

## 2020-04-16 DIAGNOSIS — L988 Other specified disorders of the skin and subcutaneous tissue: Secondary | ICD-10-CM

## 2020-04-16 NOTE — Progress Notes (Signed)
   New Patient Visit  Subjective  Sandra Chandler is a 60 y.o. female who presents for the following: Facial Elastosis (Last injection 5 months ago at [facility] in [place], [place]. Not consistent results so patient wanted to discuss treatment and get injections today.).  The following portions of the chart were reviewed this encounter and updated as appropriate:   Tobacco  Allergies  Meds  Problems  Med Hx  Surg Hx  Fam Hx     Review of Systems:  No other skin or systemic complaints except as noted in HPI or Assessment and Plan.  Objective  Well appearing patient in no apparent distress; mood and affect are within normal limits.  A focused examination was performed including the face. Relevant physical exam findings are noted in the Assessment and Plan.  Objective  Face: Rhytides and volume loss.   Images                 Assessment & Plan  Elastosis of skin Face  Botox 37.5 units injected as marked:  -Frown complex 27.5 units -Forehead 10.0 units  Botox Injection - Face Location: See attached image  Informed consent: Discussed risks (infection, pain, bleeding, bruising, swelling, allergic reaction, paralysis of nearby muscles, eyelid droop, double vision, neck weakness, difficulty breathing, headache, undesirable cosmetic result, and need for additional treatment) and benefits of the procedure, as well as the alternatives.  Informed consent was obtained.  Preparation: The area was cleansed with alcohol.  Procedure Details:  Botox was injected into the dermis with a 30-gauge needle. Pressure applied to any bleeding. Ice packs offered for swelling.  Lot Number:  [id number] Expiration:  05/2022  Total Units Injected:  37.5  Plan: Patient was instructed to remain upright for 4 hours. Patient was instructed to avoid massaging the face and avoid vigorous exercise for the rest of the day. Tylenol may be used for headache.  Allow 2 weeks before  returning to clinic for additional dosing as needed. Patient will call for any problems.   Return in about 4 weeks (around 05/14/2020) for Botox follow up .  Luther Redo, CMA, am acting as scribe for Sarina Ser, MD .  Documentation: I have reviewed the above documentation for accuracy and completeness, and I agree with the above.  Sarina Ser, MD

## 2020-04-18 ENCOUNTER — Encounter: Payer: Self-pay | Admitting: Dermatology

## 2020-05-01 ENCOUNTER — Ambulatory Visit: Payer: BC Managed Care – PPO | Admitting: Family Medicine

## 2020-05-02 NOTE — Progress Notes (Signed)
Name: Sandra Chandler   MRN: [id number]    DOB: [birth year]   Date:05/03/2020       Progress Note  Subjective  Chief Complaint  Follow Up  HPI  Hypothyroidism: weight is stable now, last TSH at goal, lipid panel improved   Overweight:  she was 182 lbs in 2020, she is smaller portions , weight is down to 157 lbs she states she has been back to gym since they opened back up and is feeling better.    Mood disorder : history of bipolar, still has difficulty sleeping but recently feeling more anxious due to increase in stress. Her son is a drug user and only works part time, stealing from her, she does not like going home. She told him to move out and hopefully today will be his last day at her house. She feels sad about it, but she is also having panic attacks now, with palpitation, difficulty breathing and feels very nervous, also feels like she will have a crying spell. She took clonazepam prn in the past. Explained that I can only give a small amount of that, but we can try other medications also like Buspar and hydroxyzine, she does not want daily medications   AR/RAD: doing well at this time, uses inhaler and flonase prn only. No cough, wheezing, she has sob with panic attacks only   Genital herpes and fever blister: she takes valtrex prn if any sensation of tingling or burning, no full blown outbreaks in many years   Patient Active Problem List   Diagnosis Date Noted  . Chondromalacia patellae 05/27/2017  . Bipolar affective disorder, most recent episode unspecified type, remission status unspecified 03/20/2015  . Major depressive disorder, recurrent, in full remission with anxious distress ([place]) 11/29/2014  . Genital herpes 11/29/2014  . Adult hypothyroidism 11/29/2014  . Menopause 11/29/2014  . Patella-femoral syndrome 11/29/2014  . Overweight 11/29/2014  . Pain in joint involving lower leg 11/29/2014  . Personal history of other malignant neoplasm of skin 11/29/2014  . History  of basal cell carcinoma 01/10/2013  . Basal cell carcinoma of skin of lip 01/10/2013  . Vitamin D deficiency 09/18/2009    Past Surgical History:  Procedure Laterality Date  . AUGMENTATION MAMMAPLASTY Bilateral    age 67 redone @ 5 years ago  . BILATERAL OOPHORECTOMY  05/2011  . BREAST SURGERY  1984  . CESAREAN SECTION     x2  . MOHS SURGERY  01/28/2013  . ROTATOR CUFF REPAIR Right 10/12/2018   Dr. Katha Hamming  . TUBAL LIGATION      Family History  Problem Relation Age of Onset  . Osteoporosis Mother   . Bipolar disorder Father   . Cancer Father        Lung  . Bipolar disorder Brother   . Breast cancer Maternal Grandmother 60  . Melanoma Paternal Grandmother     Social History   Tobacco Use  . Smoking status: Never Smoker  . Smokeless tobacco: Never Used  Substance Use Topics  . Alcohol use: Yes    Alcohol/week: 3.0 standard drinks    Types: 1 Glasses of wine, 2 Cans of beer per week     Current Outpatient Medications:  .  albuterol (VENTOLIN HFA) 108 (90 Base) MCG/ACT inhaler, Inhale 1-2 puffs into the lungs every 4 (four) hours as needed for wheezing or shortness of breath. Use 2-3 times daily routinely presently, then return to as needed use as symptoms improve, Disp: 6.7 g, Rfl: 1 .  estradiol (ESTRACE VAGINAL) 0.1 MG/GM vaginal cream, Place 1 Applicatorful vaginally 3 (three) times a week., Disp: 42.5 g, Rfl: 12 .  levothyroxine (SYNTHROID) 50 MCG tablet, Take 1 tablet (50 mcg total) by mouth daily., Disp: 90 tablet, Rfl: 1 .  valACYclovir (VALTREX) 500 MG tablet, Take 1 tablet (500 mg total) by mouth daily. And three times daily prn outbreak, Disp: 35 tablet, Rfl: 1 .  fluticasone (FLONASE) 50 MCG/ACT nasal spray, Place 2 sprays into both nostrils daily. (Patient not taking: No sig reported), Disp: 16 g, Rfl: 5 .  promethazine-dextromethorphan (PROMETHAZINE-DM) 6.25-15 MG/5ML syrup, Take 5 mLs by mouth 4 (four) times daily as needed for cough. (Patient not  taking: Reported on 05/03/2020), Disp: 180 mL, Rfl: 0  No Known Allergies  I personally reviewed active problem list, medication list, allergies, family history, social history, health maintenance with the patient/caregiver today.   ROS  Ten systems reviewed and is negative except as mentioned in HPI   Objective  Vitals:   05/03/20 1531  BP: 130/80  Pulse: (!) 103  Resp: 16  Temp: 97.9 F (36.6 C)  TempSrc: Oral  SpO2: 99%  Weight: 157 lb 12.8 oz (71.6 kg)  Height: 5\' 3"  (1.6 m)    Body mass index is 27.95 kg/m.  Physical Exam  Constitutional: Patient appears well-developed and well-nourished. No distress.  HEENT: head atraumatic, normocephalic, pupils equal and reactive to light,  neck supple Cardiovascular: Normal rate, regular rhythm and normal heart sounds.  No murmur heard. No BLE edema. Pulmonary/Chest: Effort normal and breath sounds normal. No respiratory distress. Abdominal: Soft.  There is no tenderness. Psychiatric: Patient has a normal mood and affect. behavior is normal. Judgment and thought content normal.  PHQ2/9: Depression screen [facility] 2/9 05/03/2020 12/13/2019 10/30/2019 10/24/2018 07/07/2018  Decreased Interest 0 0 0 1 0  Down, Depressed, Hopeless 0 0 0 1 0  PHQ - 2 Score 0 0 0 2 0  Altered sleeping 1 - 1 1 0  Tired, decreased energy 0 - 1 1 0  Change in appetite 0 - 0 0 0  Feeling bad or failure about yourself  0 - 0 0 0  Trouble concentrating 1 - 1 1 0  Moving slowly or fidgety/restless 0 - 1 0 0  Suicidal thoughts 0 - 0 0 -  PHQ-9 Score 2 - 4 5 0  Difficult doing work/chores - - Somewhat difficult Somewhat difficult -    phq 9 is negative   Fall Risk: Fall Risk  05/03/2020 12/13/2019 10/30/2019 10/24/2018 07/07/2018  Falls in the past year? 0 0 0 0 0  Number falls in past yr: 0 0 0 0 0  Injury with Fall? 0 0 0 0 0  Follow up - Falls evaluation completed - - -    Functional Status Survey: Is the patient deaf or have difficulty hearing?: No Does the  patient have difficulty seeing, even when wearing glasses/contacts?: Yes Does the patient have difficulty concentrating, remembering, or making decisions?: Yes Does the patient have difficulty walking or climbing stairs?: No Does the patient have difficulty dressing or bathing?: No Does the patient have difficulty doing errands alone such as visiting a doctor's office or shopping?: No   Assessment & Plan  1. Mood disorder ([place])  Does not want daily medication   2. Need for Tdap vaccination  - Tdap vaccine greater than or equal to 7yo IM  3. Adult hypothyroidism  Continue medication   4. Vitamin D deficiency  Taking  supplementation  5. Mild intermittent reactive airway disease without complication  Doing well   6. Panic attack  - clonazePAM (KLONOPIN) 0.5 MG tablet; Take 1 tablet (0.5 mg total) by mouth 2 (two) times daily as needed for anxiety.  Dispense: 10 tablet; Refill: 0 - busPIRone (BUSPAR) 5 MG tablet; Take 1 tablet (5 mg total) by mouth 2 (two) times daily as needed.  Dispense: 30 tablet; Refill: 0 - hydrOXYzine (ATARAX/VISTARIL) 10 MG tablet; Take 1 tablet (10 mg total) by mouth at bedtime as needed.  Dispense: 30 tablet; Refill: 0   7. Herpes simplex infection of genitourinary system  - valACYclovir (VALTREX) 500 MG tablet; Take 1 tablet (500 mg total) by mouth daily. And three times daily prn outbreak  Dispense: 35 tablet; Refill: 1  8. Fever blister  - valACYclovir (VALTREX) 500 MG tablet; Take 1 tablet (500 mg total) by mouth daily. And three times daily prn outbreak  Dispense: 35 tablet; Refill: 1

## 2020-05-03 ENCOUNTER — Other Ambulatory Visit: Payer: Self-pay

## 2020-05-03 ENCOUNTER — Encounter: Payer: Self-pay | Admitting: Family Medicine

## 2020-05-03 ENCOUNTER — Ambulatory Visit: Payer: BC Managed Care – PPO | Admitting: Family Medicine

## 2020-05-03 VITALS — BP 130/80 | HR 103 | Temp 97.9°F | Resp 16 | Ht 63.0 in | Wt 157.8 lb

## 2020-05-03 DIAGNOSIS — B001 Herpesviral vesicular dermatitis: Secondary | ICD-10-CM

## 2020-05-03 DIAGNOSIS — E039 Hypothyroidism, unspecified: Secondary | ICD-10-CM | POA: Diagnosis not present

## 2020-05-03 DIAGNOSIS — F410 Panic disorder [episodic paroxysmal anxiety] without agoraphobia: Secondary | ICD-10-CM

## 2020-05-03 DIAGNOSIS — Z23 Encounter for immunization: Secondary | ICD-10-CM | POA: Diagnosis not present

## 2020-05-03 DIAGNOSIS — J4520 Mild intermittent asthma, uncomplicated: Secondary | ICD-10-CM

## 2020-05-03 DIAGNOSIS — F39 Unspecified mood [affective] disorder: Secondary | ICD-10-CM

## 2020-05-03 DIAGNOSIS — E559 Vitamin D deficiency, unspecified: Secondary | ICD-10-CM | POA: Diagnosis not present

## 2020-05-03 DIAGNOSIS — A6000 Herpesviral infection of urogenital system, unspecified: Secondary | ICD-10-CM

## 2020-05-03 MED ORDER — BUSPIRONE HCL 5 MG PO TABS: 5.0000 mg | ORAL_TABLET | Freq: Two times a day (BID) | ORAL | 0 refills | Status: DC | PRN

## 2020-05-03 MED ORDER — CLONAZEPAM 0.5 MG PO TABS: 0.5000 mg | ORAL_TABLET | Freq: Two times a day (BID) | ORAL | 0 refills | Status: DC | PRN

## 2020-05-03 MED ORDER — LEVOTHYROXINE SODIUM 50 MCG PO TABS: 50.0000 ug | ORAL_TABLET | Freq: Every day | ORAL | 1 refills | Status: DC

## 2020-05-03 MED ORDER — HYDROXYZINE HCL 10 MG PO TABS: 10.0000 mg | ORAL_TABLET | Freq: Every evening | ORAL | 0 refills | Status: DC | PRN

## 2020-05-03 MED ORDER — VALACYCLOVIR HCL 500 MG PO TABS: 500.0000 mg | ORAL_TABLET | Freq: Every day | ORAL | 1 refills | Status: DC

## 2020-05-16 ENCOUNTER — Other Ambulatory Visit: Payer: Self-pay

## 2020-05-16 ENCOUNTER — Ambulatory Visit (INDEPENDENT_AMBULATORY_CARE_PROVIDER_SITE_OTHER): Payer: Self-pay | Admitting: Dermatology

## 2020-05-16 DIAGNOSIS — L988 Other specified disorders of the skin and subcutaneous tissue: Secondary | ICD-10-CM

## 2020-05-16 NOTE — Progress Notes (Signed)
   Follow-Up Visit   Subjective  Sandra Chandler is a 60 y.o. female who presents for the following: Facial Elastosis (4 week Botox follow up). The patient is pleased with her Botox results.  After reviewing everything she would like to try to decrease some of the smaller remaining horizontal wrinkles in the mid superior forehead area.  The following portions of the chart were reviewed this encounter and updated as appropriate:   Tobacco  Allergies  Meds  Problems  Med Hx  Surg Hx  Fam Hx     Review of Systems:  No other skin or systemic complaints except as noted in HPI or Assessment and Plan.  Objective  Well appearing patient in no apparent distress; mood and affect are within normal limits.  A focused examination was performed including face. Relevant physical exam findings are noted in the Assessment and Plan.  Objective  Head - Anterior (Face): Rhytides and volume loss.   Images     Assessment & Plan  Elastosis of skin Head - Anterior (Face)  2.5 units to midline superior forehead  Botox Injection - Head - Anterior (Face) Location: See attached image  Informed consent: Discussed risks (infection, pain, bleeding, bruising, swelling, allergic reaction, paralysis of nearby muscles, eyelid droop, double vision, neck weakness, difficulty breathing, headache, undesirable cosmetic result, and need for additional treatment) and benefits of the procedure, as well as the alternatives.  Informed consent was obtained.  Preparation: The area was cleansed with alcohol.  Procedure Details:  Botox was injected into the dermis with a 30-gauge needle. Pressure applied to any bleeding. Ice packs offered for swelling.  Lot Number:  [id number] Expiration:  06/2022   Total Units Injected:  2.5  Plan: Patient was instructed to remain upright for 4 hours. Patient was instructed to avoid massaging the face and avoid vigorous exercise for the rest of the day. Tylenol may be used for  headache.  Allow 2 weeks before returning to clinic for additional dosing as needed. Patient will call for any problems.   Return for Botox in 3-4 months.  I, Ashok Cordia, CMA, am acting as scribe for Sarina Ser, MD   Documentation: I have reviewed the above documentation for accuracy and completeness, and I agree with the above.  Sarina Ser, MD

## 2020-05-19 ENCOUNTER — Encounter: Payer: Self-pay | Admitting: Dermatology

## 2020-05-24 DIAGNOSIS — H2511 Age-related nuclear cataract, right eye: Secondary | ICD-10-CM | POA: Diagnosis not present

## 2020-05-29 IMAGING — CR DG KNEE STANDING AP BILAT
1 series · 1 of 1 positions shown · non-contrast
Comparison: None.

CLINICAL DATA: Bilateral knee pain

EXAM:
BILATERAL KNEES STANDING - 1 VIEW; LEFT KNEE - 3 VIEW; RIGHT KNEE -
3 VIEW

[dg knee bilateral standing ap]
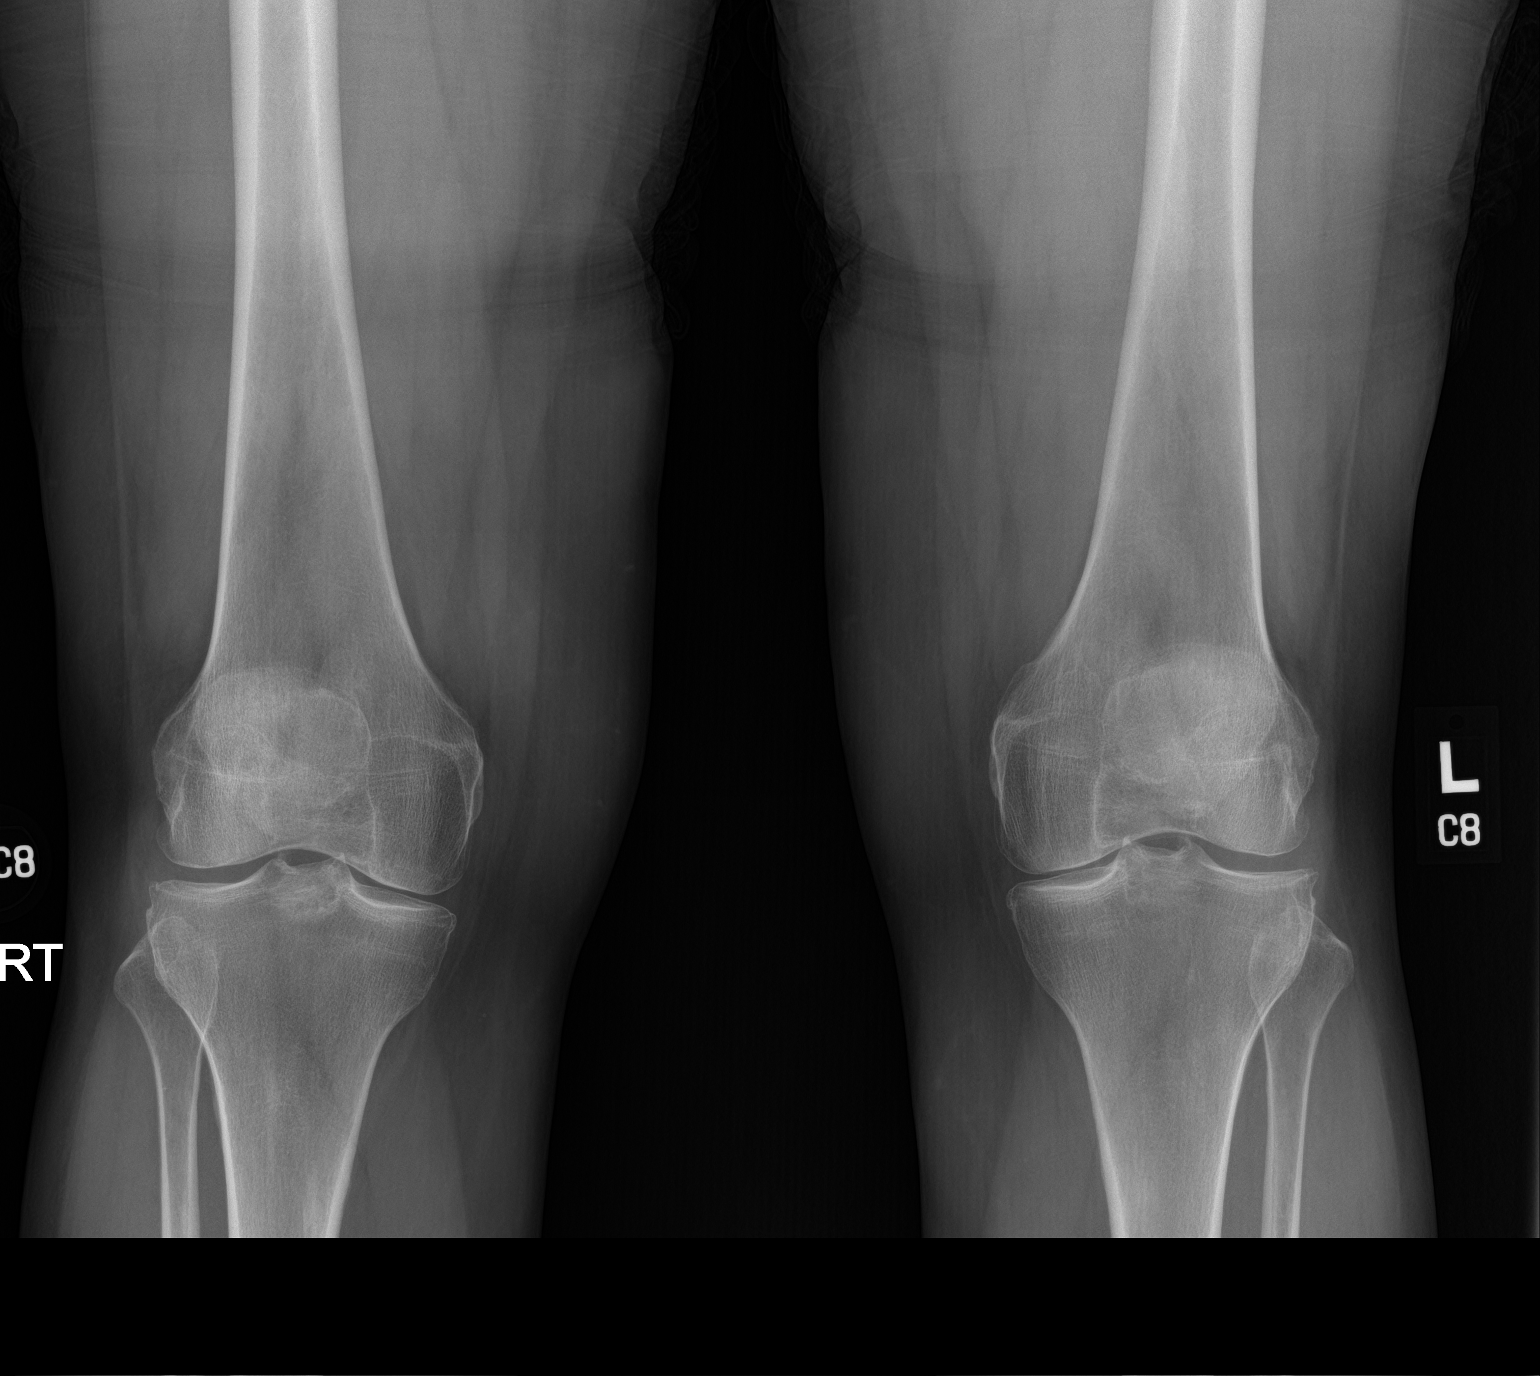

[1 of 1 positions shown; findings below may reference images not displayed]

FINDINGS: No fracture or dislocation of the bilateral knees. There is mild,
symmetric bilateral femorotibial compartment joint space loss
without significant osteophytosis and moderate bilateral
patellofemoral joint space loss and osteophytosis. There is a small,
nonspecific right knee joint effusion and no significant left knee
joint effusion. The soft tissues are unremarkable.
IMPRESSION: No fracture or dislocation of the bilateral knees. There is mild,
symmetric bilateral femorotibial compartment joint space loss
without significant osteophytosis and moderate bilateral
patellofemoral joint space loss and osteophytosis. There is a small,
nonspecific right knee joint effusion and no significant left knee
joint effusion.

## 2020-05-29 NOTE — Telephone Encounter (Signed)
errnous encounter

## 2020-06-18 DIAGNOSIS — H2512 Age-related nuclear cataract, left eye: Secondary | ICD-10-CM | POA: Diagnosis not present

## 2020-06-20 ENCOUNTER — Encounter: Payer: Self-pay | Admitting: Ophthalmology

## 2020-06-27 ENCOUNTER — Other Ambulatory Visit: Payer: BC Managed Care – PPO | Attending: Ophthalmology

## 2020-06-27 NOTE — Discharge Instructions (Signed)

## 2020-06-28 ENCOUNTER — Other Ambulatory Visit: Payer: Self-pay

## 2020-06-28 ENCOUNTER — Other Ambulatory Visit
Admission: RE | Admit: 2020-06-28 | Discharge: 2020-06-28 | Disposition: A | Discharge status: Home / Self Care | Payer: BC Managed Care – PPO | Source: Ambulatory Visit | Attending: Ophthalmology | Admitting: Ophthalmology

## 2020-06-28 DIAGNOSIS — Z20822 Contact with and (suspected) exposure to covid-19: Secondary | ICD-10-CM | POA: Insufficient documentation

## 2020-06-28 DIAGNOSIS — Z01812 Encounter for preprocedural laboratory examination: Secondary | ICD-10-CM | POA: Insufficient documentation

## 2020-06-29 LAB — SARS CORONAVIRUS 2 (TAT 6-24 HRS): SARS Coronavirus 2: NEGATIVE

## 2020-07-02 ENCOUNTER — Encounter: Payer: Self-pay | Admitting: Ophthalmology

## 2020-07-02 ENCOUNTER — Ambulatory Visit
Admission: RE | Admit: 2020-07-02 | Discharge: 2020-07-02 | Disposition: A | Discharge status: Home / Self Care | Payer: BC Managed Care – PPO | Attending: Ophthalmology | Admitting: Ophthalmology

## 2020-07-02 ENCOUNTER — Encounter
Admission: RE | Disposition: A | Discharge status: Home / Self Care | Payer: Self-pay | Source: Home / Self Care | Attending: Ophthalmology

## 2020-07-02 ENCOUNTER — Ambulatory Visit: Payer: BC Managed Care – PPO | Admitting: Anesthesiology

## 2020-07-02 ENCOUNTER — Other Ambulatory Visit: Payer: Self-pay

## 2020-07-02 DIAGNOSIS — Z90722 Acquired absence of ovaries, bilateral: Secondary | ICD-10-CM | POA: Diagnosis not present

## 2020-07-02 DIAGNOSIS — Z79890 Hormone replacement therapy (postmenopausal): Secondary | ICD-10-CM | POA: Diagnosis not present

## 2020-07-02 DIAGNOSIS — Z85828 Personal history of other malignant neoplasm of skin: Secondary | ICD-10-CM | POA: Diagnosis not present

## 2020-07-02 DIAGNOSIS — Z79899 Other long term (current) drug therapy: Secondary | ICD-10-CM | POA: Insufficient documentation

## 2020-07-02 DIAGNOSIS — Z8619 Personal history of other infectious and parasitic diseases: Secondary | ICD-10-CM | POA: Insufficient documentation

## 2020-07-02 DIAGNOSIS — H2512 Age-related nuclear cataract, left eye: Secondary | ICD-10-CM | POA: Diagnosis not present

## 2020-07-02 DIAGNOSIS — H25812 Combined forms of age-related cataract, left eye: Secondary | ICD-10-CM | POA: Diagnosis not present

## 2020-07-02 HISTORY — PX: CATARACT EXTRACTION W/PHACO: SHX586

## 2020-07-02 SURGERY — PHACOEMULSIFICATION, CATARACT, WITH IOL INSERTION
Anesthesia: Monitor Anesthesia Care | Site: Eye | Laterality: Left

## 2020-07-02 MED ORDER — NA CHONDROIT SULF-NA HYALURON 40-17 MG/ML IO SOLN
INTRAOCULAR | Status: DC | PRN
  Administered 2020-07-02: 1 mL via INTRAOCULAR

## 2020-07-02 MED ORDER — EPINEPHRINE PF 1 MG/ML IJ SOLN
INTRAOCULAR | Status: DC | PRN
  Administered 2020-07-02: 63 mL via OPHTHALMIC

## 2020-07-02 MED ORDER — MIDAZOLAM HCL 2 MG/2ML IJ SOLN
INTRAMUSCULAR | Status: DC | PRN
  Administered 2020-07-02: 2 mg via INTRAVENOUS

## 2020-07-02 MED ORDER — FENTANYL CITRATE (PF) 100 MCG/2ML IJ SOLN
INTRAMUSCULAR | Status: DC | PRN
  Administered 2020-07-02 (×2): 50 ug via INTRAVENOUS

## 2020-07-02 MED ORDER — LIDOCAINE HCL (PF) 2 % IJ SOLN
INTRAOCULAR | Status: DC | PRN
  Administered 2020-07-02: 2 mL

## 2020-07-02 MED ORDER — MOXIFLOXACIN HCL 0.5 % OP SOLN
OPHTHALMIC | Status: DC | PRN
  Administered 2020-07-02: 0.2 mL via OPHTHALMIC

## 2020-07-02 MED ORDER — ARMC OPHTHALMIC DILATING DROPS
1.0000 "application " | OPHTHALMIC | Status: DC | PRN
  Administered 2020-07-02 (×3): 1 via OPHTHALMIC

## 2020-07-02 MED ORDER — OXYCODONE HCL 5 MG/5ML PO SOLN: 5.0000 mg | Freq: Once | ORAL | Status: DC | PRN

## 2020-07-02 MED ORDER — TETRACAINE HCL 0.5 % OP SOLN
1.0000 [drp] | OPHTHALMIC | Status: DC | PRN
  Administered 2020-07-02 (×3): 1 [drp] via OPHTHALMIC

## 2020-07-02 MED ORDER — LACTATED RINGERS IV SOLN: INTRAVENOUS | Status: DC

## 2020-07-02 MED ORDER — BRIMONIDINE TARTRATE-TIMOLOL 0.2-0.5 % OP SOLN
OPHTHALMIC | Status: DC | PRN
  Administered 2020-07-02: 1 [drp] via OPHTHALMIC

## 2020-07-02 MED ORDER — OXYCODONE HCL 5 MG PO TABS: 5.0000 mg | ORAL_TABLET | Freq: Once | ORAL | Status: DC | PRN

## 2020-07-02 SURGICAL SUPPLY — 20 items
CANNULA ANT/CHMB 27G (MISCELLANEOUS) ×2 IMPLANT
CANNULA ANT/CHMB 27GA (MISCELLANEOUS) ×4 IMPLANT
GLOVE BIOGEL M 8.0 STRL (GLOVE) ×1 IMPLANT
GLOVE SURG TRIUMPH 8.0 PF LTX (GLOVE) ×2 IMPLANT
GOWN STRL REUS W/ TWL LRG LVL3 (GOWN DISPOSABLE) ×2 IMPLANT
GOWN STRL REUS W/TWL LRG LVL3 (GOWN DISPOSABLE) ×4
LENS IOL ACRSF IQ VT 15 21.5 IMPLANT
LENS IOL ACRYSOF VIVITY 21.5 ×2 IMPLANT
LENS IOL VIVITY 015 21.5 ×1 IMPLANT
MARKER SKIN DUAL TIP RULER LAB (MISCELLANEOUS) ×2 IMPLANT
NDL FILTER BLUNT 18X1 1/2 (NEEDLE) ×1 IMPLANT
NEEDLE FILTER BLUNT 18X 1/2SAF (NEEDLE) ×1
NEEDLE FILTER BLUNT 18X1 1/2 (NEEDLE) ×1 IMPLANT
PACK EYE AFTER SURG (MISCELLANEOUS) ×2 IMPLANT
PACK OPTHALMIC (MISCELLANEOUS) ×2 IMPLANT
PACK PORFILIO (MISCELLANEOUS) ×2 IMPLANT
SYR 3ML LL SCALE MARK (SYRINGE) ×2 IMPLANT
SYR TB 1ML LUER SLIP (SYRINGE) ×2 IMPLANT
WATER STERILE IRR 250ML POUR (IV SOLUTION) ×2 IMPLANT
WIPE NON LINTING 3.25X3.25 (MISCELLANEOUS) ×2 IMPLANT

## 2020-07-02 NOTE — Op Note (Signed)
PREOPERATIVE DIAGNOSIS:  Nuclear sclerotic cataract of the left eye.   POSTOPERATIVE DIAGNOSIS:  Nuclear sclerotic cataract of the left eye.   OPERATIVE PROCEDURE:@   SURGEON:  Birder Robson, MD.   ANESTHESIA:  Anesthesiologist: Fidel Levy, MD CRNA: Jeannene Patella, CRNA  1.      Managed anesthesia care. 2.     0.47ml of Shugarcaine was instilled following the paracentesis   COMPLICATIONS:  None.   TECHNIQUE:   Stop and chop   DESCRIPTION OF PROCEDURE:  The patient was examined and consented in the preoperative holding area where the aforementioned topical anesthesia was applied to the left eye and then brought back to the Operating Room where the left eye was prepped and draped in the usual sterile ophthalmic fashion and a lid speculum was placed. A paracentesis was created with the side port blade and the anterior chamber was filled with viscoelastic. A near clear corneal incision was performed with the steel keratome. A continuous curvilinear capsulorrhexis was performed with a cystotome followed by the capsulorrhexis forceps. Hydrodissection and hydrodelineation were carried out with BSS on a blunt cannula. The lens was removed in a stop and chop  technique and the remaining cortical material was removed with the irrigation-aspiration handpiece. The capsular bag was inflated with viscoelastic and the Technis [id number] lens was placed in the capsular bag without complication. The remaining viscoelastic was removed from the eye with the irrigation-aspiration handpiece. The wounds were hydrated. The anterior chamber was flushed with BSS and the eye was inflated to physiologic pressure. 0.29ml Vigamox was placed in the anterior chamber. The wounds were found to be water tight. The eye was dressed with Combigan. The patient was given protective glasses to wear throughout the day and a shield with which to sleep tonight. The patient was also given drops with which to begin a drop regimen  today and will follow-up with me in one day. Implant Name Type Inv. Item Serial No. Manufacturer Lot No. LRB No. Used Action  LENS IOL ACRYSOF VIVITY 21.5 - [id number]  LENS IOL ACRYSOF VIVITY 21.5 [id number] ALCON  Left 1 Implanted    Procedure(s): CATARACT EXTRACTION PHACO AND INTRAOCULAR LENS PLACEMENT (IOC) LEFT VIVITY 3.24 00:24.2 (Left)  Electronically signed: Birder Robson 07/02/2020 8:40 AM

## 2020-07-02 NOTE — Addendum Note (Signed)
Addendum  created 07/02/20 0904 by Fidel Levy, MD   Review and Sign - Ready for Procedure, Review and Sign - Signed

## 2020-07-02 NOTE — Anesthesia Postprocedure Evaluation (Signed)
Anesthesia Post Note  Patient: Sandra Chandler  Procedure(s) Performed: CATARACT EXTRACTION PHACO AND INTRAOCULAR LENS PLACEMENT (IOC) LEFT VIVITY 3.24 00:24.2 (Left Eye)     Patient location during evaluation: PACU Anesthesia Type: MAC Level of consciousness: awake and alert Pain management: pain level controlled Vital Signs Assessment: post-procedure vital signs reviewed and stable Respiratory status: spontaneous breathing, nonlabored ventilation, respiratory function stable and patient connected to nasal cannula oxygen Cardiovascular status: stable and blood pressure returned to baseline Postop Assessment: no apparent nausea or vomiting Anesthetic complications: no   No complications documented.  Fidel Levy

## 2020-07-02 NOTE — H&P (Signed)
[facility]   Primary Care Physician:  Steele Sizer, MD Ophthalmologist: Dr. George Ina  Pre-Procedure History & Physical: HPI:  Sandra Chandler is a 60 y.o. female here for cataract surgery.   Past Medical History:  Diagnosis Date  . Chondromalacia of knee   . Depressed bipolar I disorder ([place])   . Herpes genitalis in women   . Hx of basal cell carcinoma   . Insomnia   . Patellofemoral stress syndrome   . Thyroid disease   . Vitamin D deficiency     Past Surgical History:  Procedure Laterality Date  . AUGMENTATION MAMMAPLASTY Bilateral    age 28 redone @ 5 years ago  . BILATERAL OOPHORECTOMY  05/2011  . BREAST SURGERY  1984  . CESAREAN SECTION     x2  . MOHS SURGERY  01/28/2013  . ROTATOR CUFF REPAIR Right 10/12/2018   Dr. Katha Hamming  . TUBAL LIGATION      Prior to Admission medications   Medication Sig Start Date End Date Taking? Authorizing Provider  albuterol (VENTOLIN HFA) 108 (90 Base) MCG/ACT inhaler Inhale 1-2 puffs into the lungs every 4 (four) hours as needed for wheezing or shortness of breath. Use 2-3 times daily routinely presently, then return to as needed use as symptoms improve 12/13/19  Yes Towanda Malkin, MD  busPIRone (BUSPAR) 5 MG tablet Take 1 tablet (5 mg total) by mouth 2 (two) times daily as needed. 05/03/20  Yes Sowles, Drue Stager, MD  clonazePAM (KLONOPIN) 0.5 MG tablet Take 1 tablet (0.5 mg total) by mouth 2 (two) times daily as needed for anxiety. 05/03/20  Yes Sowles, Drue Stager, MD  estradiol (ESTRACE VAGINAL) 0.1 MG/GM vaginal cream Place 1 Applicatorful vaginally 3 (three) times a week. 02/09/20  Yes Sowles, Drue Stager, MD  hydrOXYzine (ATARAX/VISTARIL) 10 MG tablet Take 1 tablet (10 mg total) by mouth at bedtime as needed. 05/03/20  Yes Sowles, Drue Stager, MD  levothyroxine (SYNTHROID) 50 MCG tablet Take 1 tablet (50 mcg total) by mouth daily. 05/03/20  Yes Sowles, Drue Stager, MD  valACYclovir (VALTREX) 500 MG tablet Take 1 tablet (500 mg  total) by mouth daily. And three times daily prn outbreak 05/03/20  Yes Sowles, Drue Stager, MD  fluticasone [facility]) 50 MCG/ACT nasal spray Place 2 sprays into both nostrils daily. Patient not taking: No sig reported 10/30/19   Steele Sizer, MD    Allergies as of 05/27/2020  . (No Known Allergies)    Family History  Problem Relation Age of Onset  . Osteoporosis Mother   . Bipolar disorder Father   . Cancer Father        Lung  . Bipolar disorder Brother   . Breast cancer Maternal Grandmother 60  . Melanoma Paternal Grandmother     Social History   Socioeconomic History  . Marital status: Divorced    Spouse name: Not on file  . Number of children: 2  . Years of education: Not on file  . Highest education level: 12th grade  Occupational History  . Occupation: purchasing     Comment: [employer]  Tobacco Use  . Smoking status: Never Smoker  . Smokeless tobacco: Never Used  Vaping Use  . Vaping Use: Never used  Substance and Sexual Activity  . Alcohol use: Yes    Alcohol/week: 3.0 standard drinks    Types: 1 Glasses of wine, 2 Cans of beer per week  . Drug use: No  . Sexual activity: Not Currently    Partners: Male    Comment:  dating, but boyfriend ED  Other Topics Concern  . Not on file  Social History Narrative   Son lives with her. .  Divorced, has two grown children.    Social Determinants of Health   Financial Resource Strain: Low Risk   . Difficulty of Paying Living Expenses: Not hard at all  Food Insecurity: No Food Insecurity  . Worried About Charity fundraiser in the Last Year: Never true  . Ran Out of Food in the Last Year: Never true  Transportation Needs: No Transportation Needs  . Lack of Transportation (Medical): No  . Lack of Transportation (Non-Medical): No  Physical Activity: Sufficiently Active  . Days of Exercise per Week: 4 days  . Minutes of Exercise per Session: 60 min  Stress: Stress Concern Present  . Feeling of Stress : Very much  Social  Connections: Moderately Integrated  . Frequency of Communication with Friends and Family: More than three times a week  . Frequency of Social Gatherings with Friends and Family: More than three times a week  . Attends Religious Services: More than 4 times per year  . Active Member of Clubs or Organizations: Yes  . Attends Archivist Meetings: More than 4 times per year  . Marital Status: Divorced  Human resources officer Violence: Not At Risk  . Fear of Current or Ex-Partner: No  . Emotionally Abused: No  . Physically Abused: No  . Sexually Abused: No    Review of Systems: See HPI, otherwise negative ROS  Physical Exam: BP 110/79   Pulse 67   Temp (!) 97 F (36.1 C) (Temporal)   Resp 16   Ht 5\' 3"  (1.6 m)   Wt 68.9 kg   SpO2 99%   BMI 26.93 kg/m  General:   Alert,  pleasant and cooperative in NAD Head:  Normocephalic and atraumatic. Respiratory:  Normal work of breathing. Cardiovascular:  RRR  Impression/Plan: Sandra Chandler is here for cataract surgery.  Risks, benefits, limitations, and alternatives regarding cataract surgery have been reviewed with the patient.  Questions have been answered.  All parties agreeable.   Birder Robson, MD  07/02/2020, 8:14 AM

## 2020-07-02 NOTE — Anesthesia Preprocedure Evaluation (Signed)
Anesthesia Evaluation  Patient identified by MRN, date of birth, ID band Patient awake    Reviewed: NPO status   History of Anesthesia Complications Negative for: history of anesthetic complications  Airway Mallampati: II  TM Distance: >3 FB Neck ROM: full    Dental no notable dental hx.    Pulmonary asthma (mild) ,    Pulmonary exam normal        Cardiovascular Exercise Tolerance: Good negative cardio ROS Normal cardiovascular exam     Neuro/Psych Anxiety negative neurological ROS     GI/Hepatic negative GI ROS, Neg liver ROS,   Endo/Other  Hypothyroidism   Renal/GU negative Renal ROS  negative genitourinary   Musculoskeletal  (+) Arthritis ,   Abdominal   Peds  Hematology negative hematology ROS (+)   Anesthesia Other Findings covid NEG.  Reproductive/Obstetrics                             Anesthesia Physical Anesthesia Plan  ASA: II  Anesthesia Plan: MAC   Post-op Pain Management:    Induction:   PONV Risk Score and Plan: 2 and Midazolam and TIVA  Airway Management Planned:   Additional Equipment:   Intra-op Plan:   Post-operative Plan:   Informed Consent: I have reviewed the patients History and Physical, chart, labs and discussed the procedure including the risks, benefits and alternatives for the proposed anesthesia with the patient or authorized representative who has indicated his/her understanding and acceptance.       Plan Discussed with: CRNA  Anesthesia Plan Comments:         Anesthesia Quick Evaluation

## 2020-07-02 NOTE — Transfer of Care (Signed)
Immediate Anesthesia Transfer of Care Note  Patient: Sandra Chandler  Procedure(s) Performed: CATARACT EXTRACTION PHACO AND INTRAOCULAR LENS PLACEMENT (IOC) LEFT VIVITY 3.24 00:24.2 (Left Eye)  Patient Location: PACU  Anesthesia Type: MAC  Level of Consciousness: awake, alert  and patient cooperative  Airway and Oxygen Therapy: Patient Spontanous Breathing and Patient connected to supplemental oxygen  Post-op Assessment: Post-op Vital signs reviewed, Patient's Cardiovascular Status Stable, Respiratory Function Stable, Patent Airway and No signs of Nausea or vomiting  Post-op Vital Signs: Reviewed and stable  Complications: No complications documented.

## 2020-07-02 NOTE — Anesthesia Procedure Notes (Signed)
Procedure Name: MAC Date/Time: 07/02/2020 8:24 AM Performed by: Jeannene Patella, CRNA Pre-anesthesia Checklist: Patient identified, Emergency Drugs available, Suction available, Timeout performed and Patient being monitored Patient Re-evaluated:Patient Re-evaluated prior to induction Oxygen Delivery Method: Nasal cannula Placement Confirmation: positive ETCO2

## 2020-07-03 ENCOUNTER — Other Ambulatory Visit: Payer: Self-pay | Admitting: Family Medicine

## 2020-07-03 ENCOUNTER — Encounter: Payer: Self-pay | Admitting: Ophthalmology

## 2020-07-03 DIAGNOSIS — F410 Panic disorder [episodic paroxysmal anxiety] without agoraphobia: Secondary | ICD-10-CM

## 2020-07-03 NOTE — Telephone Encounter (Signed)
Requested medication (s) are due for refill today: yes  Requested medication (s) are on the active medication list: yes  Last refill:  05/03/20 #10  0 refills  Future visit scheduled: 05/03/20  Notes to clinic:  not delegated per protocol     Requested Prescriptions  Pending Prescriptions Disp Refills   clonazePAM (KLONOPIN) 0.5 MG tablet [Pharmacy Med Name: CLONAZEPAM 0.5 MG TAB] 10 tablet     Sig: TAKE 1 TABLET BY MOUTH 2 TIMES A DAY AS NEEDED FOR ANXIETY      Not Delegated - Psychiatry:  Anxiolytics/Hypnotics Failed - 07/03/2020  1:51 PM      Failed - This refill cannot be delegated      Failed - Urine Drug Screen completed in last 360 days      Passed - Valid encounter within last 6 months    Recent Outpatient Visits           2 months ago Mood disorder [facility])   [facility] Steele Sizer, MD   6 months ago Cough   [place], MD   8 months ago Mood disorder [facility])   [facility] Steele Sizer, MD   1 year ago Adult hypothyroidism   [facility] Steele Sizer, MD   1 year ago Mood disorder [facility])   [facility] Steele Sizer, MD                 Signed Prescriptions Disp Refills   hydrOXYzine (ATARAX/VISTARIL) 10 MG tablet 30 tablet 0    Sig: TAKE 1 TABLET BY MOUTH AT BEDTIME AS NEEDED      Ear, Nose, and Throat:  Antihistamines Passed - 07/03/2020  1:51 PM      Passed - Valid encounter within last 12 months    Recent Outpatient Visits           2 months ago Mood disorder [facility])   [facility] Steele Sizer, MD   6 months ago Cough   [place], MD   8 months ago Mood disorder [facility])   [facility] Steele Sizer, MD   1 year ago Adult hypothyroidism   [facility] Steele Sizer, MD   1 year ago Mood disorder [facility])   [facility] [place], Drue Stager, MD                  busPIRone (BUSPAR) 5 MG tablet 30 tablet 0    Sig: TAKE 1 TABLET BY MOUTH 2 TIMES DAILY AS NEEDED      Psychiatry: Anxiolytics/Hypnotics - Non-controlled Passed - 07/03/2020  1:51 PM      Passed - Valid encounter within last 6 months    Recent Outpatient Visits           2 months ago Mood disorder [facility])   [facility] Steele Sizer, MD   6 months ago Cough   [place], MD   8 months ago Mood disorder [facility])   [facility] Steele Sizer, MD   1 year ago Adult hypothyroidism   [facility] Steele Sizer, MD   1 year ago Mood disorder [facility])   [facility] Steele Sizer, MD

## 2020-07-08 DIAGNOSIS — H2511 Age-related nuclear cataract, right eye: Secondary | ICD-10-CM | POA: Diagnosis not present

## 2020-07-10 ENCOUNTER — Other Ambulatory Visit: Payer: Self-pay

## 2020-07-10 ENCOUNTER — Encounter: Payer: Self-pay | Admitting: Ophthalmology

## 2020-07-15 NOTE — Discharge Instructions (Signed)

## 2020-07-16 ENCOUNTER — Encounter
Admission: RE | Disposition: A | Discharge status: Home / Self Care | Payer: Self-pay | Source: Home / Self Care | Attending: Ophthalmology

## 2020-07-16 ENCOUNTER — Encounter: Payer: Self-pay | Admitting: Ophthalmology

## 2020-07-16 ENCOUNTER — Ambulatory Visit
Admission: RE | Admit: 2020-07-16 | Discharge: 2020-07-16 | Disposition: A | Discharge status: Home / Self Care | Payer: BC Managed Care – PPO | Attending: Ophthalmology | Admitting: Ophthalmology

## 2020-07-16 ENCOUNTER — Ambulatory Visit: Payer: BC Managed Care – PPO | Admitting: Anesthesiology

## 2020-07-16 ENCOUNTER — Other Ambulatory Visit: Payer: Self-pay

## 2020-07-16 DIAGNOSIS — H2511 Age-related nuclear cataract, right eye: Secondary | ICD-10-CM | POA: Diagnosis not present

## 2020-07-16 DIAGNOSIS — Z79899 Other long term (current) drug therapy: Secondary | ICD-10-CM | POA: Insufficient documentation

## 2020-07-16 DIAGNOSIS — Z961 Presence of intraocular lens: Secondary | ICD-10-CM | POA: Diagnosis not present

## 2020-07-16 DIAGNOSIS — Z79890 Hormone replacement therapy (postmenopausal): Secondary | ICD-10-CM | POA: Insufficient documentation

## 2020-07-16 DIAGNOSIS — H25811 Combined forms of age-related cataract, right eye: Secondary | ICD-10-CM | POA: Diagnosis not present

## 2020-07-16 DIAGNOSIS — Z9842 Cataract extraction status, left eye: Secondary | ICD-10-CM | POA: Insufficient documentation

## 2020-07-16 HISTORY — PX: CATARACT EXTRACTION W/PHACO: SHX586

## 2020-07-16 SURGERY — PHACOEMULSIFICATION, CATARACT, WITH IOL INSERTION
Anesthesia: Monitor Anesthesia Care | Site: Eye | Laterality: Right

## 2020-07-16 MED ORDER — ARMC OPHTHALMIC DILATING DROPS
1.0000 "application " | OPHTHALMIC | Status: DC | PRN
  Administered 2020-07-16 (×3): 1 via OPHTHALMIC

## 2020-07-16 MED ORDER — MIDAZOLAM HCL 2 MG/2ML IJ SOLN
INTRAMUSCULAR | Status: DC | PRN
  Administered 2020-07-16: 2 mg via INTRAVENOUS

## 2020-07-16 MED ORDER — NA CHONDROIT SULF-NA HYALURON 40-17 MG/ML IO SOLN
INTRAOCULAR | Status: DC | PRN
  Administered 2020-07-16: 1 mL via INTRAOCULAR

## 2020-07-16 MED ORDER — BRIMONIDINE TARTRATE-TIMOLOL 0.2-0.5 % OP SOLN
OPHTHALMIC | Status: DC | PRN
  Administered 2020-07-16: 1 [drp] via OPHTHALMIC

## 2020-07-16 MED ORDER — LIDOCAINE HCL (PF) 2 % IJ SOLN
INTRAOCULAR | Status: DC | PRN
  Administered 2020-07-16: 1 mL

## 2020-07-16 MED ORDER — TETRACAINE HCL 0.5 % OP SOLN
1.0000 [drp] | OPHTHALMIC | Status: DC | PRN
  Administered 2020-07-16 (×3): 1 [drp] via OPHTHALMIC

## 2020-07-16 MED ORDER — FENTANYL CITRATE (PF) 100 MCG/2ML IJ SOLN
INTRAMUSCULAR | Status: DC | PRN
  Administered 2020-07-16: 100 ug via INTRAVENOUS

## 2020-07-16 MED ORDER — MOXIFLOXACIN HCL 0.5 % OP SOLN
OPHTHALMIC | Status: DC | PRN
  Administered 2020-07-16: 0.2 mL via OPHTHALMIC

## 2020-07-16 MED ORDER — EPINEPHRINE PF 1 MG/ML IJ SOLN
INTRAOCULAR | Status: DC | PRN
  Administered 2020-07-16: 62 mL via OPHTHALMIC

## 2020-07-16 SURGICAL SUPPLY — 21 items
CANNULA ANT/CHMB 27G (MISCELLANEOUS) ×2 IMPLANT
CANNULA ANT/CHMB 27GA (MISCELLANEOUS) ×4 IMPLANT
GLOVE SURG TRIUMPH 8.0 PF LTX (GLOVE) ×4 IMPLANT
GOWN STRL REUS W/ TWL LRG LVL3 (GOWN DISPOSABLE) ×2 IMPLANT
GOWN STRL REUS W/TWL LRG LVL3 (GOWN DISPOSABLE) ×4
LENS IOL ACRSF VT TRC 315 22.0 IMPLANT
LENS IOL ACRYSOF VIVITY 22.0 ×2 IMPLANT
LENS IOL VIVITY 315 22.0 ×1 IMPLANT
MARKER SKIN DUAL TIP RULER LAB (MISCELLANEOUS) ×2 IMPLANT
NDL FILTER BLUNT 18X1 1/2 (NEEDLE) ×1 IMPLANT
NEEDLE FILTER BLUNT 18X 1/2SAF (NEEDLE) ×1
NEEDLE FILTER BLUNT 18X1 1/2 (NEEDLE) ×1 IMPLANT
PACK EYE AFTER SURG (MISCELLANEOUS) ×2 IMPLANT
PACK OPTHALMIC (MISCELLANEOUS) ×2 IMPLANT
PACK PORFILIO (MISCELLANEOUS) ×2 IMPLANT
SUT ETHILON 10-0 CS-B-6CS-B-6 (SUTURE)
SUTURE EHLN 10-0 CS-B-6CS-B-6 (SUTURE) IMPLANT
SYR 3ML LL SCALE MARK (SYRINGE) ×2 IMPLANT
SYR TB 1ML LUER SLIP (SYRINGE) ×2 IMPLANT
WATER STERILE IRR 250ML POUR (IV SOLUTION) ×2 IMPLANT
WIPE NON LINTING 3.25X3.25 (MISCELLANEOUS) ×2 IMPLANT

## 2020-07-16 NOTE — Anesthesia Procedure Notes (Signed)
Procedure Name: MAC Performed by: Jennilyn Esteve M, CRNA Pre-anesthesia Checklist: Patient identified, Emergency Drugs available, Timeout performed, Patient being monitored and Suction available Patient Re-evaluated:Patient Re-evaluated prior to induction Oxygen Delivery Method: Nasal cannula       

## 2020-07-16 NOTE — Transfer of Care (Signed)
Immediate Anesthesia Transfer of Care Note  Patient: Sandra Chandler  Procedure(s) Performed: CATARACT EXTRACTION PHACO AND INTRAOCULAR LENS PLACEMENT (IOC) RIGHT VIVITY (Right Eye)  Patient Location: PACU  Anesthesia Type: MAC  Level of Consciousness: awake, alert  and patient cooperative  Airway and Oxygen Therapy: Patient Spontanous Breathing and Patient connected to supplemental oxygen  Post-op Assessment: Post-op Vital signs reviewed, Patient's Cardiovascular Status Stable, Respiratory Function Stable, Patent Airway and No signs of Nausea or vomiting  Post-op Vital Signs: Reviewed and stable  Complications: No complications documented.

## 2020-07-16 NOTE — Anesthesia Preprocedure Evaluation (Signed)
Anesthesia Evaluation  Patient identified by MRN, date of birth, ID band Patient awake    Reviewed: Allergy & Precautions, NPO status   History of Anesthesia Complications Negative for: history of anesthetic complications  Airway Mallampati: II  TM Distance: >3 FB Neck ROM: full    Dental   Pulmonary asthma (mild) ,    Pulmonary exam normal        Cardiovascular Exercise Tolerance: Good Normal cardiovascular exam     Neuro/Psych Anxiety    GI/Hepatic   Endo/Other  Hypothyroidism   Renal/GU      Musculoskeletal  (+) Arthritis ,   Abdominal   Peds  Hematology   Anesthesia Other Findings covid NEG.  Reproductive/Obstetrics                             Anesthesia Physical  Anesthesia Plan  ASA: II  Anesthesia Plan: MAC   Post-op Pain Management:    Induction:   PONV Risk Score and Plan: 2 and Midazolam, TIVA and Treatment may vary due to age or medical condition  Airway Management Planned: Nasal Cannula  Additional Equipment:   Intra-op Plan:   Post-operative Plan:   Informed Consent: I have reviewed the patients History and Physical, chart, labs and discussed the procedure including the risks, benefits and alternatives for the proposed anesthesia with the patient or authorized representative who has indicated his/her understanding and acceptance.       Plan Discussed with: CRNA  Anesthesia Plan Comments:         Anesthesia Quick Evaluation

## 2020-07-16 NOTE — Op Note (Signed)
PREOPERATIVE DIAGNOSIS:  Nuclear sclerotic cataract of the right eye.   POSTOPERATIVE DIAGNOSIS:  Nuclear sclerotic cataract of the right eye.   OPERATIVE PROCEDURE: Procedure(s): CATARACT EXTRACTION PHACO AND INTRAOCULAR LENS PLACEMENT (IOC) RIGHT VIVITY   SURGEON:  Birder Robson, MD.   ANESTHESIA: 1.      Managed anesthesia care. 2.     0.33ml of Shugarcaine was instilled following the paracentesis  Anesthesiologist: Veda Canning, MD CRNA: Izetta Dakin, CRNA  COMPLICATIONS:  None.   TECHNIQUE:   Stop and chop    DESCRIPTION OF PROCEDURE:  The patient was examined and consented in the preoperative holding area where the aforementioned topical anesthesia was applied to the right eye.  The patient was brought back to the Operating Room where he was sat upright on the gurney and given a target to fixate upon while the eye was marked at the 3:00 and 9:00 position.  The patient was then reclined on the operating table.  The eye was prepped and draped in the usual sterile ophthalmic fashion and a lid speculum was placed. A paracentesis was created with the side port blade and the anterior chamber was filled with viscoelastic. A near clear corneal incision was performed with the steel keratome. A continuous curvilinear capsulorrhexis was performed with a cystotome followed by the capsulorrhexis forceps. Hydrodissection and hydrodelineation were carried out with BSS on a blunt cannula. The lens was removed in a stop and chop technique and the remaining cortical material was removed with the irrigation-aspiration handpiece. The eye was inflated with viscoelastic and the DFT  lens  was placed in the eye and rotated to within a few degrees of the predetermined orientation.  The remaining viscoelastic was removed from the eye.  The Sinskey hook was used to rotate the toric lens into its final resting place at 110 degrees.  0. The eye was inflated to a physiologic pressure and found to be watertight.  0.4ml of Vigamox was placed in the anterior chamber.  The eye was dressed with Vigamox. The patient was given protective glasses to wear throughout the day and a shield with which to sleep tonight. The patient was also given drops with which to begin a drop regimen today and will follow-up with me in one day. Implant Name Type Inv. Item Serial No. Manufacturer Lot No. LRB No. Used Action  LENS IOL ACRYSOF VIVITY 22.0 - [id number]  LENS IOL ACRYSOF VIVITY 22.0 [id number] ALCON  Right 1 Implanted   Procedure(s) with comments: CATARACT EXTRACTION PHACO AND INTRAOCULAR LENS PLACEMENT (IOC) RIGHT VIVITY (Right) - 2.93 0:20.8  Electronically signed: Birder Robson 07/16/2020 10:26 AM

## 2020-07-16 NOTE — H&P (Signed)
[facility]   Primary Care Physician:  Steele Sizer, MD Ophthalmologist: Dr.Kwynn Schlotter  Pre-Procedure History & Physical: HPI:  Sandra Chandler is a 60 y.o. female here for cataract surgery.   Past Medical History:  Diagnosis Date  . Chondromalacia of knee   . Depressed bipolar I disorder ([place])   . Herpes genitalis in women   . Hx of basal cell carcinoma   . Insomnia   . Patellofemoral stress syndrome   . Thyroid disease   . Vitamin D deficiency     Past Surgical History:  Procedure Laterality Date  . AUGMENTATION MAMMAPLASTY Bilateral    age 77 redone @ 5 years ago  . BILATERAL OOPHORECTOMY  05/2011  . BREAST SURGERY  1984  . CATARACT EXTRACTION W/PHACO Left 07/02/2020   Procedure: CATARACT EXTRACTION PHACO AND INTRAOCULAR LENS PLACEMENT (IOC) LEFT VIVITY 3.24 00:24.2;  Surgeon: Birder Robson, MD;  Location: [facility];  Service: Ophthalmology;  Laterality: Left;  . CESAREAN SECTION     x2  . MOHS SURGERY  01/28/2013  . ROTATOR CUFF REPAIR Right 10/12/2018   Dr. Katha Hamming  . TUBAL LIGATION      Prior to Admission medications   Medication Sig Start Date End Date Taking? Authorizing Provider  albuterol (VENTOLIN HFA) 108 (90 Base) MCG/ACT inhaler Inhale 1-2 puffs into the lungs every 4 (four) hours as needed for wheezing or shortness of breath. Use 2-3 times daily routinely presently, then return to as needed use as symptoms improve 12/13/19  Yes Towanda Malkin, MD  busPIRone (BUSPAR) 5 MG tablet TAKE 1 TABLET BY MOUTH 2 TIMES DAILY AS NEEDED 07/03/20  Yes Sowles, Drue Stager, MD  clonazePAM (KLONOPIN) 0.5 MG tablet Take 1 tablet (0.5 mg total) by mouth 2 (two) times daily as needed for anxiety. 05/03/20  Yes Sowles, Drue Stager, MD  estradiol (ESTRACE VAGINAL) 0.1 MG/GM vaginal cream Place 1 Applicatorful vaginally 3 (three) times a week. 02/09/20  Yes Sowles, Drue Stager, MD  hydrOXYzine (ATARAX/VISTARIL) 10 MG tablet TAKE 1 TABLET BY MOUTH AT BEDTIME AS  NEEDED 07/03/20  Yes Sowles, Drue Stager, MD  levothyroxine (SYNTHROID) 50 MCG tablet Take 1 tablet (50 mcg total) by mouth daily. 05/03/20  Yes Sowles, Drue Stager, MD  valACYclovir (VALTREX) 500 MG tablet Take 1 tablet (500 mg total) by mouth daily. And three times daily prn outbreak 05/03/20  Yes Sowles, Drue Stager, MD  fluticasone [facility]) 50 MCG/ACT nasal spray Place 2 sprays into both nostrils daily. Patient not taking: No sig reported 10/30/19   Steele Sizer, MD    Allergies as of 05/27/2020  . (No Known Allergies)    Family History  Problem Relation Age of Onset  . Osteoporosis Mother   . Bipolar disorder Father   . Cancer Father        Lung  . Bipolar disorder Brother   . Breast cancer Maternal Grandmother 60  . Melanoma Paternal Grandmother     Social History   Socioeconomic History  . Marital status: Divorced    Spouse name: Not on file  . Number of children: 2  . Years of education: Not on file  . Highest education level: 12th grade  Occupational History  . Occupation: purchasing     Comment: [employer]  Tobacco Use  . Smoking status: Never Smoker  . Smokeless tobacco: Never Used  Vaping Use  . Vaping Use: Never used  Substance and Sexual Activity  . Alcohol use: Yes    Alcohol/week: 3.0 standard drinks    Types:  1 Glasses of wine, 2 Cans of beer per week  . Drug use: No  . Sexual activity: Not Currently    Partners: Male    Comment: dating, but boyfriend ED  Other Topics Concern  . Not on file  Social History Narrative   Son lives with her. .  Divorced, has two grown children.    Social Determinants of Health   Financial Resource Strain: Low Risk   . Difficulty of Paying Living Expenses: Not hard at all  Food Insecurity: No Food Insecurity  . Worried About Charity fundraiser in the Last Year: Never true  . Ran Out of Food in the Last Year: Never true  Transportation Needs: No Transportation Needs  . Lack of Transportation (Medical): No  . Lack of Transportation  (Non-Medical): No  Physical Activity: Sufficiently Active  . Days of Exercise per Week: 4 days  . Minutes of Exercise per Session: 60 min  Stress: Stress Concern Present  . Feeling of Stress : Very much  Social Connections: Moderately Integrated  . Frequency of Communication with Friends and Family: More than three times a week  . Frequency of Social Gatherings with Friends and Family: More than three times a week  . Attends Religious Services: More than 4 times per year  . Active Member of Clubs or Organizations: Yes  . Attends Archivist Meetings: More than 4 times per year  . Marital Status: Divorced  Human resources officer Violence: Not At Risk  . Fear of Current or Ex-Partner: No  . Emotionally Abused: No  . Physically Abused: No  . Sexually Abused: No    Review of Systems: See HPI, otherwise negative ROS  Physical Exam: BP 102/77   Pulse 73   Temp (!) 97.1 F (36.2 C) (Temporal)   Resp 14   Ht 5\' 3"  (1.6 m)   Wt 68 kg   SpO2 99%   BMI 26.57 kg/m  General:   Alert,  pleasant and cooperative in NAD Head:  Normocephalic and atraumatic. Respiratory:  Normal work of breathing. Cardiovascular:  RRR  Impression/Plan: Sandra Chandler is here for cataract surgery.  Risks, benefits, limitations, and alternatives regarding cataract surgery have been reviewed with the patient.  Questions have been answered.  All parties agreeable.   Birder Robson, MD  07/16/2020, 9:59 AM

## 2020-07-16 NOTE — Anesthesia Postprocedure Evaluation (Signed)
Anesthesia Post Note  Patient: Sandra Chandler  Procedure(s) Performed: CATARACT EXTRACTION PHACO AND INTRAOCULAR LENS PLACEMENT (IOC) RIGHT VIVITY (Right Eye)     Patient location during evaluation: PACU Anesthesia Type: MAC Level of consciousness: awake Pain management: pain level controlled Vital Signs Assessment: post-procedure vital signs reviewed and stable Respiratory status: respiratory function stable Cardiovascular status: stable Postop Assessment: no apparent nausea or vomiting Anesthetic complications: no   No complications documented.  Veda Canning

## 2020-07-17 ENCOUNTER — Encounter: Payer: Self-pay | Admitting: Ophthalmology

## 2020-08-13 ENCOUNTER — Ambulatory Visit (INDEPENDENT_AMBULATORY_CARE_PROVIDER_SITE_OTHER): Payer: Self-pay | Admitting: Dermatology

## 2020-08-13 ENCOUNTER — Other Ambulatory Visit: Payer: Self-pay

## 2020-08-13 DIAGNOSIS — L988 Other specified disorders of the skin and subcutaneous tissue: Secondary | ICD-10-CM

## 2020-08-13 NOTE — Progress Notes (Signed)
   Follow-Up Visit   Subjective  Sandra Chandler is a 60 y.o. female who presents for the following: Facial Elastosis (3 months f/u Botox on her face).  The following portions of the chart were reviewed this encounter and updated as appropriate:   Tobacco  Allergies  Meds  Problems  Med Hx  Surg Hx  Fam Hx     Review of Systems:  No other skin or systemic complaints except as noted in HPI or Assessment and Plan.  Objective  Well appearing patient in no apparent distress; mood and affect are within normal limits.  A focused examination was performed including face. Relevant physical exam findings are noted in the Assessment and Plan.  Objective  face: Rhytides and volume loss.   Images     Assessment & Plan  Elastosis of skin face  Botox today 40 units  Frown complex 27.5 units Forehead 12.5 units  Intralesional injection - face Location: See attached image  Informed consent: Discussed risks (infection, pain, bleeding, bruising, swelling, allergic reaction, paralysis of nearby muscles, eyelid droop, double vision, neck weakness, difficulty breathing, headache, undesirable cosmetic result, and need for additional treatment) and benefits of the procedure, as well as the alternatives.  Informed consent was obtained.  Preparation: The area was cleansed with alcohol.  Procedure Details:  Botox was injected into the dermis with a 30-gauge needle. Pressure applied to any bleeding. Ice packs offered for swelling.  Lot Number:  [id number] Expiration:  08/2022  Total Units Injected: 40  Plan: Patient was instructed to remain upright for 4 hours. Patient was instructed to avoid massaging the face and avoid vigorous exercise for the rest of the day. Tylenol may be used for headache.  Allow 2 weeks before returning to clinic for additional dosing as needed. Patient will call for any problems.   Return for Botox in 3-4 months.  IMarye Round, CMA, am acting as scribe  for Sarina Ser, MD .  Documentation: I have reviewed the above documentation for accuracy and completeness, and I agree with the above.  Sarina Ser, MD

## 2020-08-13 NOTE — Patient Instructions (Addendum)

## 2020-08-23 ENCOUNTER — Encounter: Payer: Self-pay | Admitting: Dermatology

## 2020-09-04 ENCOUNTER — Ambulatory Visit: Payer: Self-pay | Admitting: *Deleted

## 2020-09-04 NOTE — Telephone Encounter (Signed)
Pt has scheduled with Malachy Mood

## 2020-09-04 NOTE — Telephone Encounter (Signed)
Rectal bleeding has been going on for 2 weeks on and off.  Patient was given appointment to see Sandra Chandler on Friday and informed to got to ER or UC if symptoms persist. Patient stated she would like to see Dr. Ancil Boozer next week if possible

## 2020-09-04 NOTE — Telephone Encounter (Signed)
Pt reports rectal bleeding, onset 2 week ago, "Got better then came back." States States bright red, with each stool "Sometimes just drips with little straining and no BM." Stools are soft, no constipation. "Turns commode water red. This am pt noted small clots. Denies any rectal pain or itching, no abdominal pain, no dizziness, afebrile. No availability within protocol timeframe of 24 hrs.appt. Assured pt NT would route to practice for PCPs review and final disposition. Care advise given, worsening symptoms go to ED. Pt verbalizes understanding.  Please advise: [phone]  Reason for Disposition . MODERATE rectal bleeding (small blood clots, passing blood without stool, or toilet water turns red)  Answer Assessment - Initial Assessment Questions 1. APPEARANCE of BLOOD: "What color is it?" "Is it passed separately, on the surface of the stool, or mixed in with the stool?"      Bright red, varies. Noted small clots today. 2. AMOUNT: "How much blood was passed?"      Turns water all red 3. FREQUENCY: "How many times has blood been passed with the stools?"      Each stool, without stools as well. 4. ONSET: "When was the blood first seen in the stools?" (Days or weeks)      2 weeks ago, comes and goes 5. DIARRHEA: "Is there also some diarrhea?" If Yes, ask: "How many diarrhea stools in the past 24 hours?"      Little soft 6. CONSTIPATION: "Do you have constipation?" If Yes, ask: "How bad is it?"     no 7. RECURRENT SYMPTOMS: "Have you had blood in your stools before?" If Yes, ask: "When was the last time?" and "What happened that time?"      no 8. BLOOD THINNERS: "Do you take any blood thinners?" (e.g., Coumadin/warfarin, Pradaxa/dabigatran, aspirin)     no 9. OTHER SYMPTOMS: "Do you have any other symptoms?"  (e.g., abdomen pain, vomiting, dizziness, fever)     no  Protocols used: RECTAL BLEEDING-A-AH

## 2020-09-06 ENCOUNTER — Ambulatory Visit: Payer: BC Managed Care – PPO | Admitting: Unknown Physician Specialty

## 2020-09-19 ENCOUNTER — Other Ambulatory Visit: Payer: Self-pay

## 2020-09-19 ENCOUNTER — Ambulatory Visit: Payer: BC Managed Care – PPO | Admitting: Unknown Physician Specialty

## 2020-09-19 ENCOUNTER — Encounter: Payer: Self-pay | Admitting: Unknown Physician Specialty

## 2020-09-19 VITALS — BP 112/72 | HR 97 | Temp 98.3°F | Resp 14 | Ht 63.0 in | Wt 154.7 lb

## 2020-09-19 DIAGNOSIS — K921 Melena: Secondary | ICD-10-CM | POA: Diagnosis not present

## 2020-09-19 DIAGNOSIS — R5383 Other fatigue: Secondary | ICD-10-CM | POA: Diagnosis not present

## 2020-09-19 DIAGNOSIS — R1033 Periumbilical pain: Secondary | ICD-10-CM

## 2020-09-19 LAB — COMPLETE METABOLIC PANEL WITH GFR
AG Ratio: 1.7 (calc) (ref 1.0–2.5)
ALT: 19 U/L (ref 6–29)
AST: 25 U/L (ref 10–35)
Albumin: 4.8 g/dL (ref 3.6–5.1)
Alkaline phosphatase (APISO): 60 U/L (ref 37–153)
BUN: 21 mg/dL (ref 7–25)
CO2: 25 mmol/L (ref 20–32)
Calcium: 10 mg/dL (ref 8.6–10.4)
Chloride: 103 mmol/L (ref 98–110)
Creat: 0.8 mg/dL (ref 0.50–1.05)
GFR, Est African American: 95 mL/min/{1.73_m2} (ref >=60)
GFR, Est Non African American: 82 mL/min/{1.73_m2} (ref >=60)
Globulin: 2.8 g/dL (calc) (ref 1.9–3.7)
Glucose, Bld: 86 mg/dL (ref 65–99)
Potassium: 4.9 mmol/L (ref 3.5–5.3)
Sodium: 138 mmol/L (ref 135–146)
Total Bilirubin: 0.4 mg/dL (ref 0.2–1.2)
Total Protein: 7.6 g/dL (ref 6.1–8.1)

## 2020-09-19 LAB — CBC WITH DIFFERENTIAL/PLATELET
Absolute Monocytes: 902 cells/uL (ref 200–950)
Basophils Absolute: 67 cells/uL (ref 0–200)
Basophils Relative: 0.7 %
Eosinophils Absolute: 134 cells/uL (ref 15–500)
Eosinophils Relative: 1.4 %
HCT: 38.7 % (ref 35.0–45.0)
Hemoglobin: 12.9 g/dL (ref 11.7–15.5)
Lymphs Abs: 2054 cells/uL (ref 850–3900)
MCH: 30.4 pg (ref 27.0–33.0)
MCHC: 33.3 g/dL (ref 32.0–36.0)
MCV: 91.3 fL (ref 80.0–100.0)
MPV: 10.9 fL (ref 7.5–12.5)
Monocytes Relative: 9.4 %
Neutro Abs: 6442 cells/uL (ref 1500–7800)
Neutrophils Relative %: 67.1 %
Platelets: 295 10*3/uL (ref 140–400)
RBC: 4.24 10*6/uL (ref 3.80–5.10)
RDW: 12.9 % (ref 11.0–15.0)
Total Lymphocyte: 21.4 %
WBC: 9.6 10*3/uL (ref 3.8–10.8)

## 2020-09-19 LAB — TSH: TSH: 0.68 mIU/L (ref 0.40–4.50)

## 2020-09-19 NOTE — Progress Notes (Signed)
BP 112/72   Pulse 97   Temp 98.3 F (36.8 C) (Oral)   Resp 14   Ht 5\' 3"  (1.6 m)   Wt 154 lb 11.2 oz (70.2 kg)   SpO2 98%   BMI 27.40 kg/m    Subjective:    Patient ID: Sandra Chandler, female    DOB: 09-07-1960, 60 y.o.   MRN: [id number]  HPI: Sandra Chandler is a 60 y.o. female  Chief Complaint  Patient presents with   Abdominal Pain   Rectal Bleeding    For 1 month   Abdominal Pain This is a new problem. The current episode started in the past 7 days. The problem occurs intermittently. The problem has been gradually worsening. The pain is located in the periumbilical region. The quality of the pain is aching. The abdominal pain does not radiate. Pertinent negatives include no weight loss. Nothing aggravates the pain. The pain is relieved by Nothing. Sandra Chandler has tried nothing for the symptoms.   Blood in stool is daily.  States blood is both around and mixed in with the stool    Relevant past medical, surgical, family and social history reviewed and updated as indicated. Interim medical history since our last visit reviewed. Allergies and medications reviewed and updated.  Review of Systems  Constitutional:  Positive for fatigue. Negative for weight loss.  Neurological:        Noting brain fog during the day.     Per HPI unless specifically indicated above     Objective:    BP 112/72   Pulse 97   Temp 98.3 F (36.8 C) (Oral)   Resp 14   Ht 5\' 3"  (1.6 m)   Wt 154 lb 11.2 oz (70.2 kg)   SpO2 98%   BMI 27.40 kg/m   Wt Readings from Last 3 Encounters:  09/19/20 154 lb 11.2 oz (70.2 kg)  07/16/20 150 lb (68 kg)  07/02/20 152 lb (68.9 kg)    Physical Exam Constitutional:      General: Sandra Chandler is not in acute distress.    Appearance: Normal appearance. Sandra Chandler is well-developed.  HENT:     Head: Normocephalic and atraumatic.  Eyes:     General: Lids are normal. No scleral icterus.       Right eye: No discharge.        Left eye: No discharge.      Conjunctiva/sclera: Conjunctivae normal.  Neck:     Vascular: No carotid bruit or JVD.  Cardiovascular:     Rate and Rhythm: Normal rate and regular rhythm.     Heart sounds: Normal heart sounds.  Pulmonary:     Effort: Pulmonary effort is normal. No respiratory distress.     Breath sounds: Normal breath sounds.  Abdominal:     Palpations: There is no hepatomegaly or splenomegaly.     Tenderness: There is no abdominal tenderness.  Musculoskeletal:        General: Normal range of motion.     Cervical back: Normal range of motion and neck supple.  Skin:    General: Skin is warm and dry.     Coloration: Skin is not pale.     Findings: No rash.  Neurological:     Mental Status: Sandra Chandler is alert and oriented to person, place, and time.  Psychiatric:        Behavior: Behavior normal.        Thought Content: Thought content normal.  Judgment: Judgment normal.    Results for orders placed or performed during the hospital encounter of 06/28/20  SARS CORONAVIRUS 2 (TAT 6-24 HRS) Nasopharyngeal Nasopharyngeal Swab   Specimen: Nasopharyngeal Swab  Result Value Ref Range   SARS Coronavirus 2 NEGATIVE NEGATIVE      Assessment & Plan:   Problem List Items Addressed This Visit   None Visit Diagnoses     Blood in stool, frank    -  Primary   Relevant Orders   Ambulatory referral to Gastroenterology   CBC with Differential/Platelet   Periumbilical abdominal pain       Relevant Orders   COMPLETE METABOLIC PANEL WITH GFR   Fatigue, unspecified type       Relevant Orders   CBC with Differential/Platelet   TSH        Follow up plan: With Gastroenterology.  Urgent referral placed

## 2020-09-23 ENCOUNTER — Encounter: Payer: Self-pay | Admitting: *Deleted

## 2020-10-02 ENCOUNTER — Other Ambulatory Visit: Payer: Self-pay | Admitting: Family Medicine

## 2020-10-02 DIAGNOSIS — E039 Hypothyroidism, unspecified: Secondary | ICD-10-CM

## 2020-10-21 DIAGNOSIS — M76892 Other specified enthesopathies of left lower limb, excluding foot: Secondary | ICD-10-CM | POA: Diagnosis not present

## 2020-10-21 DIAGNOSIS — M7062 Trochanteric bursitis, left hip: Secondary | ICD-10-CM | POA: Diagnosis not present

## 2020-10-21 DIAGNOSIS — M25552 Pain in left hip: Secondary | ICD-10-CM | POA: Diagnosis not present

## 2020-11-04 ENCOUNTER — Ambulatory Visit: Payer: BC Managed Care – PPO | Admitting: Gastroenterology

## 2020-11-14 ENCOUNTER — Other Ambulatory Visit: Payer: Self-pay | Admitting: Family Medicine

## 2020-11-14 DIAGNOSIS — B001 Herpesviral vesicular dermatitis: Secondary | ICD-10-CM

## 2020-11-14 DIAGNOSIS — A6000 Herpesviral infection of urogenital system, unspecified: Secondary | ICD-10-CM

## 2020-11-14 NOTE — Telephone Encounter (Signed)
Last OV 09/19/20. No future OV scheduled at this time. Approved per protocol. Requested Prescriptions  Pending Prescriptions Disp Refills  . valACYclovir (VALTREX) 500 MG tablet [Pharmacy Med Name: VALACYCLOVIR HCL 500 MG TAB] 35 tablet 1    Sig: TAKE 1 TABLET BY MOUTH DAILY.TAKE 3 TIMES DAILY AS NEEDED FOR OUTBREAK     Antimicrobials:  Antiviral Agents - Anti-Herpetic Passed - 11/14/2020  3:51 PM      Passed - Valid encounter within last 12 months    Recent Outpatient Visits          1 month ago Blood in stool, frank   [place], [place]   6 months ago Mood disorder [facility])   [facility] Steele Sizer, MD   11 months ago Cough   [place], MD   1 year ago Mood disorder [facility])   [facility] Steele Sizer, MD   2 years ago Adult hypothyroidism   [facility] Steele Sizer, MD

## 2020-11-21 ENCOUNTER — Encounter: Payer: Self-pay | Admitting: Family Medicine

## 2020-11-26 ENCOUNTER — Other Ambulatory Visit: Payer: Self-pay | Admitting: Family Medicine

## 2020-11-26 DIAGNOSIS — R051 Acute cough: Secondary | ICD-10-CM | POA: Diagnosis not present

## 2020-11-26 DIAGNOSIS — F410 Panic disorder [episodic paroxysmal anxiety] without agoraphobia: Secondary | ICD-10-CM

## 2020-11-26 DIAGNOSIS — R0981 Nasal congestion: Secondary | ICD-10-CM | POA: Diagnosis not present

## 2020-11-26 DIAGNOSIS — J019 Acute sinusitis, unspecified: Secondary | ICD-10-CM | POA: Diagnosis not present

## 2020-11-26 NOTE — Telephone Encounter (Signed)
Requested medication (s) are due for refill today:   Yes  Requested medication (s) are on the active medication list:   Yes  Future visit scheduled:   No   Last ordered: 07/03/2020 #30, 0 refills  Returned because pt needing a new Rx.   This one has expired.     Requested Prescriptions  Pending Prescriptions Disp Refills   hydrOXYzine (ATARAX/VISTARIL) 10 MG tablet [Pharmacy Med Name: HYDROXYZINE HCL 10 MG TAB] 30 tablet 0    Sig: TAKE 1 TABLET BY MOUTH AT BEDTIME AS NEEDED     Ear, Nose, and Throat:  Antihistamines Passed - 11/26/2020  9:55 AM      Passed - Valid encounter within last 12 months    Recent Outpatient Visits           2 months ago Blood in stool, frank   [facility] Kathrine Haddock, NP   6 months ago Mood disorder [facility])   [facility] Steele Sizer, MD   11 months ago Cough   [place], MD   1 year ago Mood disorder [facility])   [facility] Steele Sizer, MD   2 years ago Adult hypothyroidism   [facility] Steele Sizer, MD

## 2020-11-26 NOTE — Telephone Encounter (Signed)
Last seen 6.23.2022 no upcoming appt sch'd

## 2020-11-27 ENCOUNTER — Other Ambulatory Visit: Payer: Self-pay

## 2020-11-27 DIAGNOSIS — Z1211 Encounter for screening for malignant neoplasm of colon: Secondary | ICD-10-CM

## 2020-11-28 ENCOUNTER — Other Ambulatory Visit (INDEPENDENT_AMBULATORY_CARE_PROVIDER_SITE_OTHER): Payer: Self-pay

## 2020-11-28 DIAGNOSIS — Z1211 Encounter for screening for malignant neoplasm of colon: Secondary | ICD-10-CM

## 2020-11-28 MED ORDER — PEG 3350-KCL-NA BICARB-NACL 420 G PO SOLR: 4000.0000 mL | Freq: Once | ORAL | 0 refills | Status: AC

## 2020-11-28 NOTE — Progress Notes (Signed)
Gastroenterology Pre-Procedure Review  Request Date: 01/17/2021 Requesting Physician: Dr. Vicente Males  PATIENT REVIEW QUESTIONS: The patient responded to the following health history questions as indicated:    1. Are you having any GI issues? no 2. Do you have a personal history of Polyps? no 3. Do you have a family history of Colon Cancer or Polyps? no 4. Diabetes Mellitus? no 5. Joint replacements in the past 12 months?no 6. Major health problems in the past 3 months?no 7. Any artificial heart valves, MVP, or defibrillator?no    MEDICATIONS & ALLERGIES:    Patient reports the following regarding taking any anticoagulation/antiplatelet therapy:   Plavix, Coumadin, Eliquis, Xarelto, Lovenox, Pradaxa, Brilinta, or Effient? no Aspirin? no  Patient confirms/reports the following medications:  Current Outpatient Medications  Medication Sig Dispense Refill   albuterol (VENTOLIN HFA) 108 (90 Base) MCG/ACT inhaler Inhale 1-2 puffs into the lungs every 4 (four) hours as needed for wheezing or shortness of breath. Use 2-3 times daily routinely presently, then return to as needed use as symptoms improve 6.7 g 1   busPIRone (BUSPAR) 5 MG tablet TAKE 1 TABLET BY MOUTH 2 TIMES DAILY AS NEEDED 30 tablet 0   estradiol (ESTRACE VAGINAL) 0.1 MG/GM vaginal cream Place 1 Applicatorful vaginally 3 (three) times a week. 42.5 g 12   fluticasone (FLONASE) 50 MCG/ACT nasal spray Place 2 sprays into both nostrils daily. 16 g 5   levothyroxine (SYNTHROID) 50 MCG tablet TAKE 1 TABLET EVERY DAY ON EMPTY STOMACHWITH A GLASS OF WATER AT LEAST 30-60 MINBEFORE BREAKFAST 90 tablet 0   valACYclovir (VALTREX) 500 MG tablet TAKE 1 TABLET BY MOUTH DAILY.TAKE 3 TIMES DAILY AS NEEDED FOR OUTBREAK 90 tablet 0   No current facility-administered medications for this visit.    Patient confirms/reports the following allergies:  No Known Allergies  No orders of the defined types were placed in this encounter.   AUTHORIZATION  INFORMATION Primary Insurance: 1D#: Group #:  Secondary Insurance: 1D#: Group #:  SCHEDULE INFORMATION: Date: 01/17/2021 Time: Location: [facility]

## 2020-12-04 ENCOUNTER — Other Ambulatory Visit: Payer: Self-pay

## 2020-12-04 ENCOUNTER — Ambulatory Visit (INDEPENDENT_AMBULATORY_CARE_PROVIDER_SITE_OTHER): Payer: Self-pay | Admitting: Dermatology

## 2020-12-04 DIAGNOSIS — L988 Other specified disorders of the skin and subcutaneous tissue: Secondary | ICD-10-CM

## 2020-12-04 NOTE — Patient Instructions (Signed)

## 2020-12-04 NOTE — Progress Notes (Signed)
   Follow-Up Visit   Subjective  Sandra Chandler is a 60 y.o. female who presents for the following: Facial Elastosis (Patient is here today for Botox injections.).  The following portions of the chart were reviewed this encounter and updated as appropriate:   Tobacco  Allergies  Meds  Problems  Med Hx  Surg Hx  Fam Hx     Review of Systems:  No other skin or systemic complaints except as noted in HPI or Assessment and Plan.  Objective  Well appearing patient in no apparent distress; mood and affect are within normal limits.  A focused examination was performed including the face. Relevant physical exam findings are noted in the Assessment and Plan.  Face Rhytides and volume loss.       Assessment & Plan  Elastosis of skin Face  Botox 40 units injected as marked: -Frown complex 27.5 units -Forehead 12.5 units   Discussed laser treatment and topical retinoids.   Start Skin Medicinals Tretinoin 0.0125% cream mixture QHS. Topical retinoid medications like tretinoin/Retin-A, adapalene/Differin, tazarotene/Fabior, and Epiduo/Epiduo Forte can cause dryness and irritation when first started. Only apply a pea-sized amount to the entire affected area. Avoid applying it around the eyes, edges of mouth and creases at the nose. If you experience irritation, use a good moisturizer first and/or apply the medicine less often. If you are doing well with the medicine, you can increase how often you use it until you are applying every night. Be careful with sun protection while using this medication as it can make you sensitive to the sun. This medicine should not be used by pregnant women.    Botox Injection - Face Location: See attached image  Informed consent: Discussed risks (infection, pain, bleeding, bruising, swelling, allergic reaction, paralysis of nearby muscles, eyelid droop, double vision, neck weakness, difficulty breathing, headache, undesirable cosmetic result, and need for  additional treatment) and benefits of the procedure, as well as the alternatives.  Informed consent was obtained.  Preparation: The area was cleansed with alcohol.  Procedure Details:  Botox was injected into the dermis with a 30-gauge needle. Pressure applied to any bleeding. Ice packs offered for swelling.  Lot Number:  [id number] Expiration:  09/2022  Total Units Injected:  40  Plan: Patient was instructed to remain upright for 4 hours. Patient was instructed to avoid massaging the face and avoid vigorous exercise for the rest of the day. Tylenol may be used for headache.  Allow 2 weeks before returning to clinic for additional dosing as needed. Patient will call for any problems.   Return in about 3 months (around 03/05/2021) for Botox .  Luther Redo, CMA, am acting as scribe for Sarina Ser, MD . Documentation: I have reviewed the above documentation for accuracy and completeness, and I agree with the above.  Sarina Ser, MD

## 2020-12-05 ENCOUNTER — Encounter: Payer: Self-pay | Admitting: Dermatology

## 2021-01-08 ENCOUNTER — Encounter: Payer: Self-pay | Admitting: Family Medicine

## 2021-01-09 ENCOUNTER — Other Ambulatory Visit: Payer: Self-pay

## 2021-01-09 ENCOUNTER — Telehealth: Payer: Self-pay

## 2021-01-09 DIAGNOSIS — E039 Hypothyroidism, unspecified: Secondary | ICD-10-CM

## 2021-01-09 DIAGNOSIS — R5383 Other fatigue: Secondary | ICD-10-CM

## 2021-01-09 NOTE — Telephone Encounter (Signed)
Patient is calling because she needs to reschedule colonoscopy that is schedule for 01/17/2021

## 2021-01-09 NOTE — Telephone Encounter (Signed)
Returned patients call. LVM to call back °

## 2021-01-14 ENCOUNTER — Telehealth: Payer: Self-pay

## 2021-01-14 NOTE — Telephone Encounter (Signed)
Patient requested to reschedule procedure to 02/03/21. No updated instructions are needed. Endo unit has been notified.

## 2021-01-30 ENCOUNTER — Telehealth: Payer: Self-pay

## 2021-01-30 NOTE — Telephone Encounter (Signed)
Spoke with pharmacy and they will refill the bowel prep for patient. Pt never went to pick up prescription.

## 2021-02-03 ENCOUNTER — Ambulatory Visit: Payer: BC Managed Care – PPO | Admitting: Certified Registered"

## 2021-02-03 ENCOUNTER — Encounter: Payer: Self-pay | Admitting: Gastroenterology

## 2021-02-03 ENCOUNTER — Ambulatory Visit
Admission: RE | Admit: 2021-02-03 | Discharge: 2021-02-03 | Disposition: A | Discharge status: Home / Self Care | Payer: BC Managed Care – PPO | Attending: Gastroenterology | Admitting: Gastroenterology

## 2021-02-03 ENCOUNTER — Other Ambulatory Visit: Payer: Self-pay

## 2021-02-03 ENCOUNTER — Encounter
Admission: RE | Disposition: A | Discharge status: Home / Self Care | Payer: Self-pay | Source: Home / Self Care | Attending: Gastroenterology

## 2021-02-03 DIAGNOSIS — Z85828 Personal history of other malignant neoplasm of skin: Secondary | ICD-10-CM | POA: Diagnosis not present

## 2021-02-03 DIAGNOSIS — K5730 Diverticulosis of large intestine without perforation or abscess without bleeding: Secondary | ICD-10-CM | POA: Diagnosis not present

## 2021-02-03 DIAGNOSIS — Z1211 Encounter for screening for malignant neoplasm of colon: Secondary | ICD-10-CM

## 2021-02-03 DIAGNOSIS — E039 Hypothyroidism, unspecified: Secondary | ICD-10-CM | POA: Insufficient documentation

## 2021-02-03 HISTORY — PX: COLONOSCOPY WITH PROPOFOL: SHX5780

## 2021-02-03 SURGERY — COLONOSCOPY WITH PROPOFOL
Anesthesia: General

## 2021-02-03 MED ORDER — LIDOCAINE 2% (20 MG/ML) 5 ML SYRINGE
INTRAMUSCULAR | Status: DC | PRN
  Administered 2021-02-03: 25 mg via INTRAVENOUS

## 2021-02-03 MED ORDER — SODIUM CHLORIDE 0.9 % IV SOLN: INTRAVENOUS | Status: DC

## 2021-02-03 MED ORDER — PROPOFOL 10 MG/ML IV BOLUS
INTRAVENOUS | Status: DC | PRN
  Administered 2021-02-03: 100 mg via INTRAVENOUS

## 2021-02-03 MED ORDER — LIDOCAINE HCL (PF) 1 % IJ SOLN
INTRAMUSCULAR | Status: AC
  Administered 2021-02-03: 0.2 mL
  Filled 2021-02-03: qty 2

## 2021-02-03 MED ORDER — PROPOFOL 500 MG/50ML IV EMUL
INTRAVENOUS | Status: DC | PRN
  Administered 2021-02-03: 120 ug/kg/min via INTRAVENOUS

## 2021-02-03 NOTE — Op Note (Signed)
[facility] Gastroenterology Patient Name: Sandra Chandler Procedure Date: 02/03/2021 10:05 AM MRN: [id number] Account #: [id number] Date of Birth: [birth year] Admit Type: Outpatient Age: 60 Room: [facility] ENDO ROOM 4 Gender: Female Note Status: Finalized Instrument Name: Park Meo [id number] Procedure:             Colonoscopy Indications:           Screening for colorectal malignant neoplasm Providers:             Jonathon Bellows MD, MD Medicines:             Monitored Anesthesia Care Complications:         No immediate complications. Procedure:             Pre-Anesthesia Assessment:                        - Prior to the procedure, a History and Physical was                         performed, and patient medications, allergies and                         sensitivities were reviewed. The patient's tolerance                         of previous anesthesia was reviewed.                        - The risks and benefits of the procedure and the                         sedation options and risks were discussed with the                         patient. All questions were answered and informed                         consent was obtained.                        - ASA Grade Assessment: II - A patient with mild                         systemic disease.                        After obtaining informed consent, the colonoscope was                         passed under direct vision. Throughout the procedure,                         the patient's blood pressure, pulse, and oxygen                         saturations were monitored continuously. The                         Colonoscope was introduced through the anus and  advanced to the the cecum, identified by the                         appendiceal orifice. The colonoscopy was performed                         with ease. The patient tolerated the procedure well.                         The quality of the bowel  preparation was inadequate. Findings:      The perianal and digital rectal examinations were normal.      Extensive amounts of semi-liquid stool was found in the entire colon,       interfering with visualization.      Multiple small-mouthed diverticula were found in the sigmoid colon. Impression:            - Preparation of the colon was inadequate.                        - Stool in the entire examined colon.                        - Diverticulosis in the sigmoid colon.                        - No specimens collected. Recommendation:        - Discharge patient to home (with escort).                        - Resume previous diet.                        - Continue present medications.                        - Repeat colonoscopy in 2 weeks because the bowel                         preparation was suboptimal. Procedure Code(s):     --- Professional ---                        [id number], Colonoscopy, flexible; diagnostic, including                         collection of specimen(s) by brushing or washing, when                         performed (separate procedure) Diagnosis Code(s):     --- Professional ---                        Z12.11, Encounter for screening for malignant neoplasm                         of colon                        K57.30, Diverticulosis of large intestine without  perforation or abscess without bleeding CPT copyright 2019 American Medical Association. All rights reserved. The codes documented in this report are preliminary and upon coder review may  be revised to meet current compliance requirements. Jonathon Bellows, MD Jonathon Bellows MD, MD 02/03/2021 10:25:37 AM This report has been signed electronically. Number of Addenda: 0 Note Initiated On: 02/03/2021 10:05 AM Scope Withdrawal Time: 0 hours 1 minute 59 seconds  Total Procedure Duration: 0 hours 5 minutes 23 seconds  Estimated Blood Loss:  Estimated blood loss: none.      [facility]

## 2021-02-03 NOTE — H&P (Signed)
Jonathon Bellows, MD [address] [place], [place], [place], [address] [address], [place], [place], [place], [address] Phone: [phone]  Fax: [phone]  Primary Care Physician:  Steele Sizer, MD   Pre-Procedure History & Physical: HPI:  Sandra Chandler is a 60 y.o. female is here for an colonoscopy.   Past Medical History:  Diagnosis Date   Chondromalacia of knee    Depressed bipolar I disorder ([place])    Herpes genitalis in women    Hx of basal cell carcinoma    Insomnia    Patellofemoral stress syndrome    Thyroid disease    Vitamin D deficiency     Past Surgical History:  Procedure Laterality Date   AUGMENTATION MAMMAPLASTY Bilateral    age 36 redone @ 5 years ago   BILATERAL OOPHORECTOMY  05/2011   BREAST SURGERY  1984   CATARACT EXTRACTION W/PHACO Left 07/02/2020   Procedure: CATARACT EXTRACTION PHACO AND INTRAOCULAR LENS PLACEMENT ([place]) LEFT VIVITY 3.24 00:24.2;  Surgeon: Birder Robson, MD;  Location: [place];  Service: Ophthalmology;  Laterality: Left;   CATARACT EXTRACTION W/PHACO Right 07/16/2020   Procedure: CATARACT EXTRACTION PHACO AND INTRAOCULAR LENS PLACEMENT ([place]) RIGHT VIVITY;  Surgeon: Birder Robson, MD;  Location: [place];  Service: Ophthalmology;  Laterality: Right;  2.93 0:20.8   CESAREAN SECTION     x2   MOHS SURGERY  01/28/2013   ROTATOR CUFF REPAIR Right 10/12/2018   Dr. Katha Hamming   TUBAL LIGATION      Prior to Admission medications   Medication Sig Start Date End Date Taking? Authorizing Provider  albuterol (VENTOLIN HFA) 108 (90 Base) MCG/ACT inhaler Inhale 1-2 puffs into the lungs every 4 (four) hours as needed for wheezing or shortness of breath. Use 2-3 times daily routinely presently, then return to as needed use as symptoms improve 12/13/19  Yes Towanda Malkin, MD  estradiol (ESTRACE VAGINAL) 0.1 MG/GM vaginal cream Place 1 Applicatorful vaginally 3 (three) times a week. 02/09/20   Yes Sowles, Drue Stager, MD  fluticasone (FLONASE) 50 MCG/ACT nasal spray Place 2 sprays into both nostrils daily. 10/30/19  Yes Sowles, Drue Stager, MD  levothyroxine (SYNTHROID) 50 MCG tablet TAKE 1 TABLET EVERY DAY ON EMPTY STOMACHWITH A GLASS OF WATER AT LEAST 30-60 MINBEFORE BREAKFAST 10/02/20  Yes Sowles, Drue Stager, MD  valACYclovir (VALTREX) 500 MG tablet TAKE 1 TABLET BY MOUTH DAILY.TAKE 3 TIMES DAILY AS NEEDED FOR OUTBREAK 11/14/20  Yes Sowles, Drue Stager, MD  busPIRone (BUSPAR) 5 MG tablet TAKE 1 TABLET BY MOUTH 2 TIMES DAILY AS NEEDED Patient not taking: Reported on 02/03/2021 07/03/20   Steele Sizer, MD    Allergies as of 11/28/2020   (No Known Allergies)    Family History  Problem Relation Age of Onset   Osteoporosis Mother    Bipolar disorder Father    Cancer Father        Lung   Bipolar disorder Brother    Breast cancer Maternal Grandmother 30   Melanoma Paternal Grandmother     Social History   Socioeconomic History   Marital status: Divorced    Spouse name: Not on file   Number of children: 2   Years of education: Not on file   Highest education level: 12th grade  Occupational History   Occupation: Geologist, engineering     Comment: [employer]  Tobacco Use   Smoking status: Never   Smokeless tobacco: Never  Vaping Use   Vaping Use: Never used  Substance and  Sexual Activity   Alcohol use: Yes    Alcohol/week: 3.0 standard drinks    Types: 1 Glasses of wine, 2 Cans of beer per week   Drug use: No   Sexual activity: Not Currently    Partners: Male    Comment: dating, but boyfriend ED  Other Topics Concern   Not on file  Social History Narrative   Son lives with her. .  Divorced, has two grown children.    Social Determinants of Health   Financial Resource Strain: Not on file  Food Insecurity: Not on file  Transportation Needs: Not on file  Physical Activity: Not on file  Stress: Not on file  Social Connections: Not on file  Intimate Partner Violence: Not on file    Review  of Systems: See HPI, otherwise negative ROS  Physical Exam: BP 121/85   Pulse 63   Temp (!) 97.2 F (36.2 C) (Temporal)   Resp 18   Ht 5\' 3"  (1.6 m)   Wt 77.1 kg   SpO2 100%   BMI 30.11 kg/m  General:   Alert,  pleasant and cooperative in NAD Head:  Normocephalic and atraumatic. Neck:  Supple; no masses or thyromegaly. Lungs:  Clear throughout to auscultation, normal respiratory effort.    Heart:  +S1, +S2, Regular rate and rhythm, No edema. Abdomen:  Soft, nontender and nondistended. Normal bowel sounds, without guarding, and without rebound.   Neurologic:  Alert and  oriented x4;  grossly normal neurologically.  Impression/Plan: Sandra Chandler is here for an colonoscopy to be performed for Screening colonoscopy average risk   Risks, benefits, limitations, and alternatives regarding  colonoscopy have been reviewed with the patient.  Questions have been answered.  All parties agreeable.   Jonathon Bellows, MD  02/03/2021, 10:03 AM

## 2021-02-03 NOTE — Anesthesia Postprocedure Evaluation (Signed)
Anesthesia Post Note  Patient: Sandra Chandler  Procedure(s) Performed: COLONOSCOPY WITH PROPOFOL  Patient location during evaluation: PACU Anesthesia Type: General Level of consciousness: awake and awake and alert Pain management: satisfactory to patient Vital Signs Assessment: post-procedure vital signs reviewed and stable Respiratory status: spontaneous breathing and respiratory function stable Cardiovascular status: blood pressure returned to baseline Anesthetic complications: no   No notable events documented.   Last Vitals:  Vitals:   02/03/21 1026 02/03/21 1056  BP: 117/72   Pulse: 66   Resp:    Temp: (!) 36.1 C   SpO2: 97% 99%    Last Pain:  Vitals:   02/03/21 1056  TempSrc:   PainSc: 0-No pain                 VAN STAVEREN,Ainsleigh Kakos

## 2021-02-03 NOTE — Anesthesia Preprocedure Evaluation (Signed)
Anesthesia Evaluation  Patient identified by MRN, date of birth, ID band Patient awake    Reviewed: Allergy & Precautions, NPO status , Patient's Chart, lab work & pertinent test results  Airway Mallampati: II  TM Distance: >3 FB Neck ROM: full    Dental  (+) Teeth Intact   Pulmonary neg pulmonary ROS,    Pulmonary exam normal breath sounds clear to auscultation       Cardiovascular Exercise Tolerance: Good negative cardio ROS Normal cardiovascular exam Rhythm:Regular Rate:Normal     Neuro/Psych Anxiety Depression negative neurological ROS  negative psych ROS   GI/Hepatic negative GI ROS, Neg liver ROS,   Endo/Other  negative endocrine ROSHypothyroidism   Renal/GU negative Renal ROS  negative genitourinary   Musculoskeletal   Abdominal Normal abdominal exam  (+)   Peds negative pediatric ROS (+)  Hematology negative hematology ROS (+)   Anesthesia Other Findings Past Medical History: No date: Chondromalacia of knee No date: Depressed bipolar I disorder ([facility]) No date: Herpes genitalis in women No date: Hx of basal cell carcinoma No date: Insomnia No date: Patellofemoral stress syndrome No date: Thyroid disease No date: Vitamin D deficiency  Past Surgical History: No date: AUGMENTATION MAMMAPLASTY; Bilateral     Comment:  age 66 redone @ 5 years ago 05/2011: BILATERAL OOPHORECTOMY 1984: BREAST SURGERY 07/02/2020: CATARACT EXTRACTION W/PHACO; Left     Comment:  Procedure: CATARACT EXTRACTION PHACO AND INTRAOCULAR               LENS PLACEMENT ([place]) LEFT VIVITY 3.24 00:24.2;  Surgeon:               Birder Robson, MD;  Location: [place];                Service: Ophthalmology;  Laterality: Left; 07/16/2020: CATARACT EXTRACTION W/PHACO; Right     Comment:  Procedure: CATARACT EXTRACTION PHACO AND INTRAOCULAR               LENS PLACEMENT ([place]) RIGHT VIVITY;  Surgeon: Birder Robson, MD;  Location: [place];  Service:               Ophthalmology;  Laterality: Right;  2.93 0:20.8 No date: CESAREAN SECTION     Comment:  x2 01/28/2013: MOHS SURGERY 10/12/2018: ROTATOR CUFF REPAIR; Right     Comment:  Dr. Katha Hamming No date: TUBAL LIGATION  BMI    Body Mass Index: 30.11 kg/m      Reproductive/Obstetrics negative OB ROS                             Anesthesia Physical Anesthesia Plan  ASA: 2  Anesthesia Plan: General   Post-op Pain Management:    Induction:   PONV Risk Score and Plan: Propofol infusion and TIVA  Airway Management Planned: Nasal Cannula  Additional Equipment:   Intra-op Plan:   Post-operative Plan:   Informed Consent: I have reviewed the patients History and Physical, chart, labs and discussed the procedure including the risks, benefits and alternatives for the proposed anesthesia with the patient or authorized representative who has indicated his/her understanding and acceptance.     Dental Advisory Given  Plan Discussed with: CRNA and Surgeon  Anesthesia Plan Comments:         Anesthesia Quick Evaluation

## 2021-02-03 NOTE — Transfer of Care (Signed)
Immediate Anesthesia Transfer of Care Note  Patient: Sandra Chandler  Procedure(s) Performed: COLONOSCOPY WITH PROPOFOL  Patient Location: Endoscopy Unit  Anesthesia Type:General  Level of Consciousness: drowsy  Airway & Oxygen Therapy: Patient Spontanous Breathing  Post-op Assessment: Report given to RN and Post -op Vital signs reviewed and stable  Post vital signs: Reviewed  Last Vitals:  Vitals Value Taken Time  BP    Temp 36.1 C 02/03/21 1026  Pulse 65 02/03/21 1027  Resp 22 02/03/21 1027  SpO2 97 % 02/03/21 1027  Vitals shown include unvalidated device data.  Last Pain:  Vitals:   02/03/21 1026  TempSrc: Temporal  PainSc:          Complications: No notable events documented.

## 2021-02-04 ENCOUNTER — Encounter: Payer: Self-pay | Admitting: Gastroenterology

## 2021-02-06 ENCOUNTER — Telehealth: Payer: Self-pay | Admitting: Gastroenterology

## 2021-02-07 ENCOUNTER — Other Ambulatory Visit: Payer: Self-pay | Admitting: Family Medicine

## 2021-02-07 DIAGNOSIS — E039 Hypothyroidism, unspecified: Secondary | ICD-10-CM

## 2021-02-07 NOTE — Telephone Encounter (Signed)
Spoke with pt and she will call back to schedule appt.

## 2021-02-10 ENCOUNTER — Other Ambulatory Visit: Payer: Self-pay

## 2021-02-10 ENCOUNTER — Other Ambulatory Visit: Payer: Self-pay | Admitting: Family Medicine

## 2021-02-10 DIAGNOSIS — Z1211 Encounter for screening for malignant neoplasm of colon: Secondary | ICD-10-CM

## 2021-02-10 MED ORDER — NA SULFATE-K SULFATE-MG SULF 17.5-3.13-1.6 GM/177ML PO SOLN: 1.0000 | Freq: Once | ORAL | 0 refills | Status: AC

## 2021-02-10 NOTE — Telephone Encounter (Signed)
Procedure has been rescheduled for 02/27/21. Updated instructions will be sent.

## 2021-02-24 ENCOUNTER — Telehealth: Payer: Self-pay | Admitting: Gastroenterology

## 2021-02-24 NOTE — Telephone Encounter (Signed)
Patient wants to cancel procedure. Clinical staff will follow up with patient.

## 2021-02-24 NOTE — Telephone Encounter (Signed)
Called patient to rescheduled procedure. Unable to reach. Procedure has been cancelled and Endo unit notified of change.

## 2021-02-27 ENCOUNTER — Encounter: Admission: RE | Payer: Self-pay | Source: Home / Self Care

## 2021-02-27 ENCOUNTER — Ambulatory Visit
Admission: RE | Admit: 2021-02-27 | Payer: BC Managed Care – PPO | Source: Home / Self Care | Admitting: Gastroenterology

## 2021-02-27 SURGERY — COLONOSCOPY WITH PROPOFOL
Anesthesia: General

## 2021-03-05 DIAGNOSIS — H26491 Other secondary cataract, right eye: Secondary | ICD-10-CM | POA: Diagnosis not present

## 2021-03-10 NOTE — Progress Notes (Signed)
Name: Sandra Chandler   MRN: [id number]    DOB: [birth year]   Date:03/11/2021       Progress Note  Subjective  Chief Complaint  Follow Up  HPI  Chest tightness: she noticed anterior chest tightness associated with SOB, it comes in waves of 1-2 hours that can last up to 2 days. She denies diaphoresis. It started about 6 weeks ago and it happens a couple of times per week. She denies significant family history of cardiovascular disease . Asked about aggravating and alleviating factors and she is not sure   GERD: she states she had taken PPI in the past intermittently, she states about once a week she has regurgitation. No epigastric pain or burning sensation   Hypothyroidism: weight is up , she is feeling tired and achy,  last TSH at goal, lipid panel improved . We will recheck labs today  Colon cancer screening: she went for colonoscopy but the prep was poor , unable to afford going back.    Overweight:  she was 182 lbs in 2020, she is smaller portions and with physical activity it went down to 150's and stabilized for a while, today is up to 175 lbs. She states just re-joined the gym last week.   Mood disorder : history of bipolar, she tried multiple medications in the past. Currently on Hydroxyzine at night and is helping her fall and stay asleep - able to get 5-6 hours per night but she feels rested. Buspar only prn    AR/RAD: doing well at this time, uses inhaler and flonase prn only. No cough, wheezing lately   Genital herpes and fever blister: she takes valtrex prn if any sensation of tingling or burning, no full blown outbreaks in many years. She has medication at home   Patient Active Problem List   Diagnosis Date Noted   Chondromalacia patellae 05/27/2017   Bipolar affective disorder, most recent episode unspecified type, remission status unspecified 03/20/2015   Major depressive disorder, recurrent, in full remission with anxious distress ([place]) 11/29/2014   Genital herpes  11/29/2014   Adult hypothyroidism 11/29/2014   Menopause 11/29/2014   Patella-femoral syndrome 11/29/2014   Overweight 11/29/2014   Pain in joint involving lower leg 11/29/2014   Personal history of other malignant neoplasm of skin 11/29/2014   History of basal cell carcinoma 01/10/2013   Basal cell carcinoma of skin of lip 01/10/2013   Vitamin D deficiency 09/18/2009    Past Surgical History:  Procedure Laterality Date   AUGMENTATION MAMMAPLASTY Bilateral    age 4 redone @ 5 years ago   BILATERAL OOPHORECTOMY  05/2011   BREAST SURGERY  1984   CATARACT EXTRACTION W/PHACO Left 07/02/2020   Procedure: CATARACT EXTRACTION PHACO AND INTRAOCULAR LENS PLACEMENT ([place]) LEFT VIVITY 3.24 00:24.2;  Surgeon: Birder Robson, MD;  Location: [place];  Service: Ophthalmology;  Laterality: Left;   CATARACT EXTRACTION W/PHACO Right 07/16/2020   Procedure: CATARACT EXTRACTION PHACO AND INTRAOCULAR LENS PLACEMENT ([place]) RIGHT VIVITY;  Surgeon: Birder Robson, MD;  Location: [place];  Service: Ophthalmology;  Laterality: Right;  2.93 0:20.8   CESAREAN SECTION     x2   COLONOSCOPY WITH PROPOFOL N/A 02/03/2021   Procedure: COLONOSCOPY WITH PROPOFOL;  Surgeon: Jonathon Bellows, MD;  [facility] ENDOSCOPY;  Service: Gastroenterology;  Laterality: N/A;   MOHS SURGERY  01/28/2013   ROTATOR CUFF REPAIR Right 10/12/2018   Dr. Katha Hamming   TUBAL LIGATION      Family History  Problem Relation  Age of Onset   Osteoporosis Mother    Bipolar disorder Father    Cancer Father        Lung   Bipolar disorder Brother    Breast cancer Maternal Grandmother 67   Melanoma Paternal Grandmother     Social History   Tobacco Use   Smoking status: Never   Smokeless tobacco: Never  Substance Use Topics   Alcohol use: Yes    Alcohol/week: 3.0 standard drinks    Types: 1 Glasses of wine, 2 Cans of beer per week     Current Outpatient Medications:    albuterol (VENTOLIN HFA) 108 (90  Base) MCG/ACT inhaler, Inhale 1-2 puffs into the lungs every 4 (four) hours as needed for wheezing or shortness of breath. Use 2-3 times daily routinely presently, then return to as needed use as symptoms improve, Disp: 6.7 g, Rfl: 1   busPIRone (BUSPAR) 5 MG tablet, TAKE 1 TABLET BY MOUTH 2 TIMES DAILY AS NEEDED, Disp: 30 tablet, Rfl: 0   estradiol (ESTRACE VAGINAL) 0.1 MG/GM vaginal cream, Place 1 Applicatorful vaginally 3 (three) times a week., Disp: 42.5 g, Rfl: 12   fluticasone (FLONASE) 50 MCG/ACT nasal spray, Place 2 sprays into both nostrils daily., Disp: 16 g, Rfl: 5   hydrOXYzine (ATARAX/VISTARIL) 10 MG tablet, TAKE 1 TABLET BY MOUTH AT BEDTIME AS NEEDED, Disp: 30 tablet, Rfl: 0   levothyroxine (SYNTHROID) 50 MCG tablet, TAKE 1 TABLET EVERY DAY ON EMPTY STOMACHWITH A GLASS OF WATER AT LEAST 30-60 MINBEFORE BREAKFAST, Disp: 90 tablet, Rfl: 0   valACYclovir (VALTREX) 500 MG tablet, TAKE 1 TABLET BY MOUTH DAILY.TAKE 3 TIMES DAILY AS NEEDED FOR OUTBREAK, Disp: 90 tablet, Rfl: 0  No Known Allergies  I personally reviewed active problem list, medication list, allergies, family history, social history, health maintenance with the patient/caregiver today.   ROS  Constitutional: Negative for fever , positive for  weight change.  Respiratory: Negative for cough and shortness of breath.   Cardiovascular: positive  for chest pain or palpitations.  Gastrointestinal: Negative for abdominal pain, no bowel changes.  Musculoskeletal: Negative for gait problem or joint swelling.  Skin: Negative for rash.  Neurological: Negative for dizziness or headache.  No other specific complaints in a complete review of systems (except as listed in HPI above).   Objective  Vitals:   03/11/21 0803  BP: 116/78  Pulse: 93  Resp: 16  Temp: 98 F (36.7 C)  SpO2: 97%  Weight: 175 lb (79.4 kg)  Height: 5\' 3"  (1.6 m)    Body mass index is 31 kg/m.  Physical Exam  Constitutional: Patient appears  well-developed and well-nourished. Obese  No distress.  HEENT: head atraumatic, normocephalic, pupils equal and reactive to light, neck supple Cardiovascular: Normal rate, regular rhythm and normal heart sounds.  No murmur heard. No BLE edema. Pulmonary/Chest: Effort normal and breath sounds normal. No respiratory distress. Abdominal: Soft.  There is no tenderness. Psychiatric: Patient has a normal mood and affect. behavior is normal. Judgment and thought content normal.    PHQ2/9: Depression screen [facility] 2/9 03/11/2021 09/19/2020 05/03/2020 12/13/2019 10/30/2019  Decreased Interest 0 0 0 0 0  Down, Depressed, Hopeless 0 0 0 0 0  PHQ - 2 Score 0 0 0 0 0  Altered sleeping 0 0 1 - 1  Tired, decreased energy 0 0 0 - 1  Change in appetite 0 0 0 - 0  Feeling bad or failure about yourself  0 0 0 - 0  Trouble concentrating 0 0 1 - 1  Moving slowly or fidgety/restless 0 0 0 - 1  Suicidal thoughts 0 0 0 - 0  PHQ-9 Score 0 0 2 - 4  Difficult doing work/chores - Not difficult at all - - Somewhat difficult  Some recent data might be hidden    phq 9 is negative   Fall Risk: Fall Risk  03/11/2021 09/19/2020 05/03/2020 12/13/2019 10/30/2019  Falls in the past year? 0 0 0 0 0  Number falls in past yr: 0 0 0 0 0  Injury with Fall? 0 0 0 0 0  Risk for fall due to : No Fall Risks - - - -  Follow up Falls prevention discussed Falls evaluation completed - Falls evaluation completed -      Functional Status Survey: Is the patient deaf or have difficulty hearing?: No Does the patient have difficulty seeing, even when wearing glasses/contacts?: No Does the patient have difficulty concentrating, remembering, or making decisions?: No Does the patient have difficulty walking or climbing stairs?: No Does the patient have difficulty dressing or bathing?: No Does the patient have difficulty doing errands alone such as visiting a doctor's office or shopping?: No    Assessment & Plan  1. Adult  hypothyroidism  - TSH  2. Need for shingles vaccine  - Zoster Vaccine Adjuvanted [facility]) injection; Inject 0.5 mLs into the muscle once for 1 dose.  Dispense: 0.5 mL; Refill: 1  3. Mammogram not needed  - MM 3D SCREEN BREAST BILATERAL; Future  4. Vitamin D deficiency  - VITAMIN D 25 Hydroxy (Vit-D Deficiency, Fractures)  5. Mood disorder (HCC)  Stable  6. Dyslipidemia  - Lipid panel  7. Herpes simplex infection of genitourinary system   8. Precordial pain  - CBC with Differential/Platelet - COMPLETE METABOLIC PANEL WITH GFR - CRP High sensitivity - EKG 12-Lead  9. Other fatigue  - Vitamin B12 - Hemoglobin A1c  10. Diabetes mellitus screening  - Hemoglobin A1c  11. Panic attack  - busPIRone (BUSPAR) 5 MG tablet; TAKE 1 TABLET BY MOUTH 2 TIMES DAILY AS NEEDED  Dispense: 30 tablet; Refill: 0  12. Bronchitis  Usually associated with colds - albuterol (VENTOLIN HFA) 108 (90 Base) MCG/ACT inhaler; Inhale 1-2 puffs into the lungs every 4 (four) hours as needed for wheezing or shortness of breath. Use 2-3 times daily routinely presently, then return to as needed use as symptoms improve  Dispense: 6.7 g; Refill: 1  13. Mild intermittent reactive airway disease without complication  - albuterol (VENTOLIN HFA) 108 (90 Base) MCG/ACT inhaler; Inhale 1-2 puffs into the lungs every 4 (four) hours as needed for wheezing or shortness of breath. Use 2-3 times daily routinely presently, then return to as needed use as symptoms improve  Dispense: 6.7 g; Refill: 1  14. Vaginal dryness, menopausal  - estradiol (ESTRACE VAGINAL) 0.1 MG/GM vaginal cream; Place 1 Applicatorful vaginally 3 (three) times a week.  Dispense: 42.5 g; Refill: 12  15. Gastroesophageal reflux disease without esophagitis  - omeprazole (PRILOSEC) 40 MG capsule; Take 1 capsule (40 mg total) by mouth daily.  Dispense: 90 capsule; Refill: 0

## 2021-03-11 ENCOUNTER — Encounter: Payer: Self-pay | Admitting: Family Medicine

## 2021-03-11 ENCOUNTER — Ambulatory Visit: Payer: BC Managed Care – PPO | Admitting: Family Medicine

## 2021-03-11 VITALS — BP 116/78 | HR 93 | Temp 98.0°F | Resp 16 | Ht 63.0 in | Wt 175.0 lb

## 2021-03-11 DIAGNOSIS — J40 Bronchitis, not specified as acute or chronic: Secondary | ICD-10-CM

## 2021-03-11 DIAGNOSIS — E559 Vitamin D deficiency, unspecified: Secondary | ICD-10-CM | POA: Diagnosis not present

## 2021-03-11 DIAGNOSIS — F39 Unspecified mood [affective] disorder: Secondary | ICD-10-CM

## 2021-03-11 DIAGNOSIS — F410 Panic disorder [episodic paroxysmal anxiety] without agoraphobia: Secondary | ICD-10-CM

## 2021-03-11 DIAGNOSIS — J4520 Mild intermittent asthma, uncomplicated: Secondary | ICD-10-CM

## 2021-03-11 DIAGNOSIS — E785 Hyperlipidemia, unspecified: Secondary | ICD-10-CM

## 2021-03-11 DIAGNOSIS — Z538 Procedure and treatment not carried out for other reasons: Secondary | ICD-10-CM

## 2021-03-11 DIAGNOSIS — N951 Menopausal and female climacteric states: Secondary | ICD-10-CM

## 2021-03-11 DIAGNOSIS — Z131 Encounter for screening for diabetes mellitus: Secondary | ICD-10-CM

## 2021-03-11 DIAGNOSIS — E039 Hypothyroidism, unspecified: Secondary | ICD-10-CM | POA: Diagnosis not present

## 2021-03-11 DIAGNOSIS — Z23 Encounter for immunization: Secondary | ICD-10-CM | POA: Diagnosis not present

## 2021-03-11 DIAGNOSIS — R072 Precordial pain: Secondary | ICD-10-CM

## 2021-03-11 DIAGNOSIS — K219 Gastro-esophageal reflux disease without esophagitis: Secondary | ICD-10-CM

## 2021-03-11 DIAGNOSIS — R5383 Other fatigue: Secondary | ICD-10-CM

## 2021-03-11 DIAGNOSIS — A6000 Herpesviral infection of urogenital system, unspecified: Secondary | ICD-10-CM

## 2021-03-11 MED ORDER — ESTRADIOL 0.1 MG/GM VA CREA: 1.0000 | TOPICAL_CREAM | VAGINAL | 12 refills | Status: DC

## 2021-03-11 MED ORDER — ALBUTEROL SULFATE HFA 108 (90 BASE) MCG/ACT IN AERS: 1.0000 | INHALATION_SPRAY | RESPIRATORY_TRACT | 1 refills | Status: DC | PRN

## 2021-03-11 MED ORDER — SHINGRIX 50 MCG/0.5ML IM SUSR: 0.5000 mL | Freq: Once | INTRAMUSCULAR | 1 refills | Status: AC

## 2021-03-11 MED ORDER — OMEPRAZOLE 40 MG PO CPDR: 40.0000 mg | DELAYED_RELEASE_CAPSULE | Freq: Every day | ORAL | 0 refills | Status: DC

## 2021-03-11 MED ORDER — BUSPIRONE HCL 5 MG PO TABS: ORAL_TABLET | ORAL | 0 refills | Status: DC

## 2021-03-11 MED ORDER — HYDROXYZINE HCL 10 MG PO TABS: 10.0000 mg | ORAL_TABLET | Freq: Every evening | ORAL | 1 refills | Status: DC | PRN

## 2021-03-12 ENCOUNTER — Encounter: Payer: Self-pay | Admitting: Family Medicine

## 2021-03-12 LAB — HEMOGLOBIN A1C
Hgb A1c MFr Bld: 5.4 % of total Hgb (ref <5.7)
Mean Plasma Glucose: 108 mg/dL
eAG (mmol/L): 6 mmol/L

## 2021-03-12 LAB — COMPLETE METABOLIC PANEL WITH GFR
AG Ratio: 1.5 (calc) (ref 1.0–2.5)
ALT: 17 U/L (ref 6–29)
AST: 20 U/L (ref 10–35)
Albumin: 4.6 g/dL (ref 3.6–5.1)
Alkaline phosphatase (APISO): 52 U/L (ref 37–153)
BUN: 16 mg/dL (ref 7–25)
CO2: 25 mmol/L (ref 20–32)
Calcium: 10.1 mg/dL (ref 8.6–10.4)
Chloride: 104 mmol/L (ref 98–110)
Creat: 0.72 mg/dL (ref 0.50–1.03)
Globulin: 3 g/dL (calc) (ref 1.9–3.7)
Glucose, Bld: 86 mg/dL (ref 65–99)
Potassium: 4.3 mmol/L (ref 3.5–5.3)
Sodium: 140 mmol/L (ref 135–146)
Total Bilirubin: 0.5 mg/dL (ref 0.2–1.2)
Total Protein: 7.6 g/dL (ref 6.1–8.1)
eGFR: 97 mL/min/{1.73_m2} (ref >=60)

## 2021-03-12 LAB — VITAMIN D 25 HYDROXY (VIT D DEFICIENCY, FRACTURES): Vit D, 25-Hydroxy: 25 ng/mL — ABNORMAL LOW (ref 30–100)

## 2021-03-12 LAB — CBC WITH DIFFERENTIAL/PLATELET
Absolute Monocytes: 757 cells/uL (ref 200–950)
Basophils Absolute: 39 cells/uL (ref 0–200)
Basophils Relative: 0.5 %
Eosinophils Absolute: 94 cells/uL (ref 15–500)
Eosinophils Relative: 1.2 %
HCT: 38.4 % (ref 35.0–45.0)
Hemoglobin: 12.9 g/dL (ref 11.7–15.5)
Lymphs Abs: 1771 cells/uL (ref 850–3900)
MCH: 30.5 pg (ref 27.0–33.0)
MCHC: 33.6 g/dL (ref 32.0–36.0)
MCV: 90.8 fL (ref 80.0–100.0)
MPV: 11.2 fL (ref 7.5–12.5)
Monocytes Relative: 9.7 %
Neutro Abs: 5140 cells/uL (ref 1500–7800)
Neutrophils Relative %: 65.9 %
Platelets: 328 10*3/uL (ref 140–400)
RBC: 4.23 10*6/uL (ref 3.80–5.10)
RDW: 12.4 % (ref 11.0–15.0)
Total Lymphocyte: 22.7 %
WBC: 7.8 10*3/uL (ref 3.8–10.8)

## 2021-03-12 LAB — LIPID PANEL
Cholesterol: 247 mg/dL — ABNORMAL HIGH (ref <200)
HDL: 69 mg/dL (ref >=50)
LDL Cholesterol (Calc): 151 mg/dL (calc) — ABNORMAL HIGH
Non-HDL Cholesterol (Calc): 178 mg/dL (calc) — ABNORMAL HIGH (ref <130)
Total CHOL/HDL Ratio: 3.6 (calc) (ref <5.0)
Triglycerides: 141 mg/dL (ref <150)

## 2021-03-12 LAB — HIGH SENSITIVITY CRP: hs-CRP: 2.3 mg/L

## 2021-03-12 LAB — TSH: TSH: 1.15 mIU/L (ref 0.40–4.50)

## 2021-03-12 LAB — VITAMIN B12: Vitamin B-12: 671 pg/mL (ref 200–1100)

## 2021-04-17 ENCOUNTER — Ambulatory Visit: Payer: BC Managed Care – PPO | Admitting: Cardiology

## 2021-04-18 DIAGNOSIS — Z85828 Personal history of other malignant neoplasm of skin: Secondary | ICD-10-CM | POA: Diagnosis not present

## 2021-04-18 DIAGNOSIS — D2272 Melanocytic nevi of left lower limb, including hip: Secondary | ICD-10-CM | POA: Diagnosis not present

## 2021-04-18 DIAGNOSIS — D485 Neoplasm of uncertain behavior of skin: Secondary | ICD-10-CM | POA: Diagnosis not present

## 2021-04-18 DIAGNOSIS — L570 Actinic keratosis: Secondary | ICD-10-CM | POA: Diagnosis not present

## 2021-04-18 DIAGNOSIS — D0461 Carcinoma in situ of skin of right upper limb, including shoulder: Secondary | ICD-10-CM | POA: Diagnosis not present

## 2021-04-18 DIAGNOSIS — D2261 Melanocytic nevi of right upper limb, including shoulder: Secondary | ICD-10-CM | POA: Diagnosis not present

## 2021-04-18 DIAGNOSIS — D225 Melanocytic nevi of trunk: Secondary | ICD-10-CM | POA: Diagnosis not present

## 2021-04-21 ENCOUNTER — Encounter: Payer: BC Managed Care – PPO | Admitting: Family Medicine

## 2021-04-28 DIAGNOSIS — D0461 Carcinoma in situ of skin of right upper limb, including shoulder: Secondary | ICD-10-CM | POA: Diagnosis not present

## 2021-04-29 ENCOUNTER — Ambulatory Visit: Payer: BC Managed Care – PPO | Admitting: Dermatology

## 2021-05-08 ENCOUNTER — Ambulatory Visit: Payer: BC Managed Care – PPO | Admitting: Cardiology

## 2021-05-08 ENCOUNTER — Encounter: Payer: Self-pay | Admitting: Cardiology

## 2021-05-08 ENCOUNTER — Other Ambulatory Visit: Payer: Self-pay

## 2021-05-08 VITALS — BP 124/70 | HR 79 | Ht 63.0 in | Wt 178.0 lb

## 2021-05-08 DIAGNOSIS — E785 Hyperlipidemia, unspecified: Secondary | ICD-10-CM

## 2021-05-08 DIAGNOSIS — I447 Left bundle-branch block, unspecified: Secondary | ICD-10-CM

## 2021-05-08 DIAGNOSIS — R072 Precordial pain: Secondary | ICD-10-CM

## 2021-05-08 DIAGNOSIS — R079 Chest pain, unspecified: Secondary | ICD-10-CM | POA: Diagnosis not present

## 2021-05-08 DIAGNOSIS — R0609 Other forms of dyspnea: Secondary | ICD-10-CM

## 2021-05-08 NOTE — Patient Instructions (Signed)
Medication Instructions:   Your physician recommends that you continue on your current medications as directed. Please refer to the Current Medication list given to you today.   *If you need a refill on your cardiac medications before your next appointment, please call your pharmacy*   Lab Work:  Your physician recommends that you return for a FASTING lipid profile and CMET: The beginning of next week  - You will need to be fasting. Please do not have anything to eat or drink after midnight the morning you have the lab work. You may only have water or black coffee with no cream or sugar.   If you have labs (blood work) drawn today and your tests are completely normal, you will receive your results only by: [place] (if you have MyChart) OR A paper copy in the mail If you have any lab test that is abnormal or we need to change your treatment, we will call you to review the results.   Testing/Procedures:  Echocardiogram - Your physician has requested that you have an echocardiogram. Echocardiography is a painless test that uses sound waves to create images of your heart. It provides your doctor with information about the size and shape of your heart and how well your hearts chambers and valves are working. This procedure takes approximately one hour. There are no restrictions for this procedure.  Your cardiac CT is scheduled for February 16th, 2023 at 2:45 pm  [facility] [place], [place] [address] [phone]  Please arrive 15 mins early for check-in and test prep.    Please follow these instructions carefully (unless otherwise directed):   On the Night Before the Test: Be sure to Drink plenty of water. Do not consume any caffeinated/decaffeinated beverages or chocolate 12 hours prior to your test.   On the Day of the Test: Drink plenty of water until 1 hour prior to the test. Do not eat any food 4 hours  prior to the test. You may take your regular medications prior to the test.  FEMALES- please wear underwire-free bra if available, avoid dresses & tight clothing        After the Test: Drink plenty of water. After receiving IV contrast, you may experience a mild flushed feeling. This is normal. On occasion, you may experience a mild rash up to 24 hours after the test. This is not dangerous. If this occurs, you can take Benadryl 25 mg and increase your fluid intake. If you experience trouble breathing, this can be serious. If it is severe call 911 IMMEDIATELY. If it is mild, please call our office.  Please allow 2-4 weeks for scheduling of routine cardiac CTs. Some insurance companies require a pre-authorization which may delay scheduling of this test.   For non-scheduling related questions, please contact the cardiac imaging nurse navigator should you have any questions/concerns: Marchia Bond, Cardiac Imaging Nurse Navigator Gordy Clement, Cardiac Imaging Nurse Navigator [facility] Direct Office Dial: [phone]   For scheduling needs, including cancellations and rescheduling, please call [place], [phone].     Follow-Up: At [facility], you and your health needs are our priority.  As part of our continuing mission to provide you with exceptional heart care, we have created designated Provider Care Teams.  These Care Teams include your primary Cardiologist (physician) and Advanced Practice Providers (APPs -  Physician Assistants and Nurse Practitioners) who all work together to provide you with the care you  need, when you need it.  We recommend signing up for the patient portal called "MyChart".  Sign up information is provided on this After Visit Summary.  MyChart is used to connect with patients for Virtual Visits (Telemedicine).  Patients are able to view lab/test results, encounter notes, upcoming appointments, etc.  Non-urgent messages can be sent  to your provider as well.   To learn more about what you can do with MyChart, go to [email or web address].    Your next appointment:   4-6 week(s)  The format for your next appointment:   In Person  Provider:   You may see Glenetta Hew, MD or one of the following Advanced Practice Providers on your designated Care Team:   Murray Hodgkins, NP Christell Faith, PA-C Cadence Kathlen Mody, PA-C:1}    Other Instructions N/A

## 2021-05-08 NOTE — Progress Notes (Signed)
Primary Care Provider: Steele Sizer, MD [facility] Cardiologist: None Electrophysiologist: None  Clinic Note: Chief Complaint  Patient presents with   New Patient (Initial Visit)    Referred by PCP for chest pain and SOB. Meds reviewed verbally with patient.    Chest Pain    Shortness of breath   Abnormal ECG    Left bundle branch block    ===================================  ASSESSMENT/PLAN   Problem List Items Addressed This Visit       Cardiology Problems   Hyperlipidemia with target LDL less than 100    Poorly controlled lipids as of December.  And the patient is now has the pulmonary block and concerning symptoms for coronary ischemia, need to exclude CAD.  Thankfully, she does not have hypertension.  Low threshold to consider statin therapy, but will start off with Coronary CTA.  (This will not only show there is any ischemic CAD but also evaluate Coronary Calcium Score.)      LBBB (left bundle branch block) (Chronic)    Not sure how old this bundle branch block he has, but she clearly has had change in symptoms over the last couple months now with excessive exercise intolerance, fatigue, dyspnea and chest pain.  Need to exclude ischemic etiology but also need to exclude cardiomyopathy from bundle branch block.  Plan: We will check 2D echo and Coronary CTA (stress test would not be interpretable from EKG standpoint, and my fear would be that septal motion artifact would cause inaccurate reading of potential anterior defect)      Relevant Orders   EKG 12-Lead     Other   Precordial pain - Primary (Chronic)    Relatively new onset (several months of what sounds like exertional chest discomfort which could be an anginal equivalent.  This is associated with significant dyspnea and now EKG showing left bundle branch block which is concerning for ischemic disease.  Plan: Check Coronary CTA (Myoview or GXT are not a good option given the bundle-branch block.       DOE (dyspnea on exertion) (Chronic)    Pretty significant exercise intolerance with exertional dyspnea.  Concerned this could be anginal or heart failure nature.  She does not have PND orthopnea, but definitely has fatigue and dyspnea.  Plan: Check 2D echo and Coronary CTA for ischemic evaluation.      Relevant Orders   ECHOCARDIOGRAM COMPLETE    ===================================  HPI:    Sandra Chandler is a 61 y.o. female with PMH notable for MDD (Bipolar Disorder), and Hypothyroidism who is being seen today for the evaluation of LEFT BUNDLE BRANCH BLOCK and CHEST PAIN at the request of Steele Sizer, MD.  Sandra Chandler was last seen by Dr. Ancil Boozer on March 11, 2021.  She at that time was noticing some chest tightness and dyspnea that would come and go in waves.  Symptoms could last up to 2 days.  The episodes began in mid November and would happen every couple weeks.  Noted that her weight was up in the process of feeling tired and achy.  Had just gone for colonoscopy, but was not able to complete because of poor prep.  Unfortunately, she was not able to afford going back.  Had just rejoined the gym.  Recent Hospitalizations: Most recently for colonoscopy in November 2022.  Reviewed  CV studies:    The following studies were reviewed today: (if available, images/films reviewed: From Epic Chart or Care Everywhere) None:   Interval  History:   Sandra Chandler presents today with c/o CP/pressure & SOB mostly occurring with activity. -- with doing chores, stairs.  Not while grocery shopping. Sx progressed over last several months.  She describes a sensation of tightness across her chest with a pressure sensation that makes her hard for her to breathe.  It does usually happen with exertion and is made worse if she tries to continue to go forward.  There has been a few times and happens at rest, for the most part that is when she is stressed out.  She is very concerned because up  till about a year or so ago she was very active. She asked if this could be related to the fact she has had several runs with COVID but after the third 1 this past year, she just has not really bounced back like she used to.  Feels like she has gained ~10 lb & aged 78yr in last yr.    Thankfully, she really does not have any heart failure symptoms of PND, orthopnea or edema and no palpitations.  Mostly exercise intolerance  CV Review of Symptoms (Summary) Cardiovascular ROS: positive for - chest pain, dyspnea on exertion, and exercise intolerance; legs & hips feel stiff; not sure about swelling, but feels like gained wgt.  negative for - edema, irregular heartbeat, loss of consciousness, orthopnea, palpitations, paroxysmal nocturnal dyspnea, rapid heart rate, shortness of breath, or syncope/ near syncoe, TIA/amaurosis fugax, claudication  REVIEWED OF SYSTEMS   Review of Systems  Constitutional:  Positive for malaise/fatigue (Feels like she is walking through waist deep water). Negative for weight loss (Feels like she has gained weight).  HENT:  Negative for congestion and nosebleeds.   Respiratory:  Positive for shortness of breath (With exertion, or with being stressed.). Negative for cough.   Cardiovascular:        Per HPI  Gastrointestinal:  Negative for blood in stool and melena.  Genitourinary:  Negative for hematuria.  Musculoskeletal:  Positive for back pain and joint pain.       Feels like she is stiff from her back and waist down through her legs.  Neurological:  Negative for dizziness, focal weakness and weakness.  Psychiatric/Behavioral:  Negative for depression and memory loss. The patient is nervous/anxious and has insomnia (Not really sleeping well.).    I have reviewed and (if needed) personally updated the patient's problem list, medications, allergies, past medical and surgical history, social and family history.   PAST MEDICAL HISTORY   Past Medical History:   Diagnosis Date   Chondromalacia of knee    Depressed bipolar I disorder ([place]    Herpes genitalis in women    Hx of basal cell carcinoma    Insomnia    Patellofemoral stress syndrome    Thyroid disease    Vitamin D deficiency     PAST SURGICAL HISTORY   Past Surgical History:  Procedure Laterality Date   AUGMENTATION MAMMAPLASTY Bilateral    age [age]redone @ 5 years ago   BILATERAL OOPHORECTOMY  05/2011   BREAST SURGERY  1984   CATARACT EXTRACTION W/PHACO Left 07/02/2020   Procedure: CATARACT EXTRACTION PHACO AND INTRAOCULAR LENS PLACEMENT ([place]IVITY 3.24 00:24.2;  Surgeon: PBirder Robson MD;  [facility]  Service: Ophthalmology;  Laterality: Left;   CATARACT EXTRACTION W/PHACO Right 07/16/2020   Procedure: CATARACT EXTRACTION PHACO AND INTRAOCULAR LENS PLACEMENT ([place] RIGHT VIVITY;  Surgeon: PBirder Robson MD;  [facility]  Service: Ophthalmology;  Laterality: Right;  2.93 0:20.8   CESAREAN SECTION     x2   COLONOSCOPY WITH PROPOFOL N/A 02/03/2021   Procedure: COLONOSCOPY WITH PROPOFOL;  Surgeon: Jonathon Bellows, MD;  [facility];  Service: Gastroenterology;  Laterality: N/A;   MOHS SURGERY  01/28/2013   ROTATOR CUFF REPAIR Right 10/12/2018   Dr. Katha Hamming   TUBAL LIGATION      Immunization History  Administered Date(s) Administered   Influenza Split 01/12/2012   Influenza, Seasonal, Injecte, Preservative Fre 02/27/2014   Influenza,inj,Quad PF,6+ Mos 01/02/2013, 11/29/2014, 12/03/2015   Influenza,inj,quad, With Preservative 01/11/2018   Influenza-Unspecified 02/27/2014, 11/08/2018   Td 05/16/2009   Tdap 05/16/2009, 05/03/2020   Zoster Recombinat (Shingrix) 03/22/2021   Zoster, Live 02/27/2014    MEDICATIONS/ALLERGIES   Current Meds  Medication Sig   albuterol (VENTOLIN HFA) 108 (90 Base) MCG/ACT inhaler Inhale 1-2 puffs into the lungs every 4 (four) hours as needed for wheezing or shortness of  breath. Use 2-3 times daily routinely presently, then return to as needed use as symptoms improve   busPIRone (BUSPAR) 5 MG tablet TAKE 1 TABLET BY MOUTH 2 TIMES DAILY AS NEEDED   estradiol (ESTRACE VAGINAL) 0.1 MG/GM vaginal cream Place 1 Applicatorful vaginally 3 (three) times a week.   fluticasone (FLONASE) 50 MCG/ACT nasal spray Place 2 sprays into both nostrils daily.   hydrOXYzine (ATARAX) 10 MG tablet Take 1 tablet (10 mg total) by mouth at bedtime as needed.   levothyroxine (SYNTHROID) 50 MCG tablet TAKE 1 TABLET EVERY DAY ON EMPTY STOMACHWITH A GLASS OF WATER AT LEAST 30-60 MINBEFORE BREAKFAST   omeprazole (PRILOSEC) 40 MG capsule Take 1 capsule (40 mg total) by mouth daily.   valACYclovir (VALTREX) 500 MG tablet TAKE 1 TABLET BY MOUTH DAILY.TAKE 3 TIMES DAILY AS NEEDED FOR OUTBREAK    No Known Allergies  SOCIAL HISTORY/FAMILY HISTORY   Reviewed in Epic:   Social History   Tobacco Use   Smoking status: Never   Smokeless tobacco: Never  Vaping Use   Vaping Use: Never used  Substance Use Topics   Alcohol use: Yes    Alcohol/week: 3.0 standard drinks    Types: 1 Glasses of wine, 2 Cans of beer per week   Drug use: No   Social History   Social History Narrative   Son lives with her. .  Divorced, has two grown children.    Family History  Problem Relation Age of Onset   Osteoporosis Mother    Bipolar disorder Father    Cancer Father        Lung   Bipolar disorder Brother    Breast cancer Maternal Grandmother 22   Melanoma Paternal Grandmother     OBJCTIVE -PE, EKG, labs   Wt Readings from Last 3 Encounters:  05/08/21 178 lb (80.7 kg)  03/11/21 175 lb (79.4 kg)  02/03/21 170 lb (77.1 kg)    Physical Exam: BP 124/70 (BP Location: Left Arm, Patient Position: Sitting, Cuff Size: Normal)    Pulse 79    Ht _0  (1.6 m)    Wt 178 lb (80.7 kg)    SpO2 99%    BMI 31.53 kg/m  Physical Exam Vitals reviewed.  Constitutional:      General: She is not in acute  distress.    Appearance: Normal appearance. She is obese. She is not ill-appearing or toxic-appearing.     Comments: Borderline obese but otherwise healthy-appearing.  Well-groomed.  HENT:  Head: Normocephalic and atraumatic.  Eyes:     Extraocular Movements: Extraocular movements intact.     Pupils: Pupils are equal, round, and reactive to light.  Neck:     Vascular: No carotid bruit or JVD.  Cardiovascular:     Rate and Rhythm: Normal rate and regular rhythm. No extrasystoles are present.    Chest Wall: PMI is not displaced.     Pulses: Normal pulses.     Heart sounds: Normal heart sounds, S1 normal and S2 normal. No murmur heard.   No friction rub. No gallop.  Pulmonary:     Effort: Pulmonary effort is normal. No respiratory distress.     Breath sounds: Normal breath sounds.  Chest:     Chest wall: No tenderness.  Abdominal:     General: Bowel sounds are normal. There is no distension.     Palpations: Abdomen is soft. There is no mass.     Tenderness: There is no abdominal tenderness.     Comments: No HSM or bruit  Musculoskeletal:        General: No swelling. Normal range of motion.     Cervical back: Normal range of motion and neck supple.  Skin:    General: Skin is warm and dry.  Neurological:     General: No focal deficit present.     Mental Status: She is alert and oriented to person, place, and time.     Gait: Gait normal.  Psychiatric:        Mood and Affect: Mood normal.        Behavior: Behavior normal.        Thought Content: Thought content normal.        Judgment: Judgment normal.     Adult ECG Report 03/11/2021: Rate: 68;  Rhythm: normal sinus rhythm and left axis deviation (-37 ), LBBB ; borderline low voltage in precordial leads.  Narrative Interpretation: LBBB not previously seen, but most recent EKG was from 2016.   Rate: 79;  Rhythm: normal sinus rhythm and LBBB.  Axis normal.  Borderline low voltage. ;   Narrative Interpretation: Axis is less  leftward, otherwise stable  Recent Labs: Reviewed Lab Results  Component Value Date   CHOL 247 (H) 03/11/2021   HDL 69 03/11/2021   LDLCALC 151 (H) 03/11/2021   TRIG 141 03/11/2021   CHOLHDL 3.6 03/11/2021   Lab Results  Component Value Date   CREATININE 0.72 03/11/2021   BUN 16 03/11/2021   NA 140 03/11/2021   K 4.3 03/11/2021   CL 104 03/11/2021   CO2 25 03/11/2021   CBC Latest Ref Rng & Units 03/11/2021 09/19/2020 10/30/2019  WBC 3.8 - 10.8 Thousand/uL 7.8 9.6 6.7  Hemoglobin 11.7 - 15.5 g/dL 12.9 12.9 12.7  Hematocrit 35.0 - 45.0 % 38.4 38.7 39.3  Platelets 140 - 400 Thousand/uL 328 295 279    Lab Results  Component Value Date   HGBA1C 5.4 03/11/2021   Lab Results  Component Value Date   TSH 1.15 03/11/2021    ==================================================  COVID-19 Education: The signs and symptoms of COVID-19 were discussed with the patient and how to seek care for testing (follow up with PCP or arrange E-visit).    I spent a total of 22 minutes with the patient spent in direct patient consultation.  Additional time spent with chart review  / charting (studies, outside notes, etc): 32 min Total Time: 52 min  Current medicines are reviewed at length with the patient today.  (+/-  concerns) n/a  This visit occurred during the SARS-CoV-2 public health emergency.  Safety protocols were in place, including screening questions prior to the visit, additional usage of staff PPE, and extensive cleaning of exam room while observing appropriate contact time as indicated for disinfecting solutions.  Notice: This dictation was prepared with Dragon dictation along with smart phrase technology. Any transcriptional errors that result from this process are unintentional and may not be corrected upon review.   Studies Ordered:  Orders Placed This Encounter  Procedures   CT CORONARY MORPH W/CTA COR W/SCORE W/CA W/CM &/OR WO/CM   Lipid Profile   Comp Met (CMET)   EKG  12-Lead   ECHOCARDIOGRAM COMPLETE    Patient Instructions / Medication Changes & Studies & Tests Ordered   Patient Instructions  Medication Instructions:   Your physician recommends that you continue on your current medications as directed. Please refer to the Current Medication list given to you today.   *If you need a refill on your cardiac medications before your next appointment, please call your pharmacy*   Lab Work:  Your physician recommends that you return for a FASTING lipid profile and CMET: The beginning of next week  - You will need to be fasting. Please do not have anything to eat or drink after midnight the morning you have the lab work. You may only have water or black coffee with no cream or sugar.   If you have labs (blood work) drawn today and your tests are completely normal, you will receive your results only by: [place] (if you have MyChart) OR A paper copy in the mail If you have any lab test that is abnormal or we need to change your treatment, we will call you to review the results.   Testing/Procedures:  Echocardiogram - Your physician has requested that you have an echocardiogram. Echocardiography is a painless test that uses sound waves to create images of your heart. It provides your doctor with information about the size and shape of your heart and how well your hearts chambers and valves are working. This procedure takes approximately one hour. There are no restrictions for this procedure.  Your cardiac CT is scheduled for February 16th, 2023 at 2:45 pm  [facility], [place], [place] [address] [phone]  Please arrive 15 mins early for check-in and test prep.    Please follow these instructions carefully (unless otherwise directed):   On the Night Before the Test: Be sure to Drink plenty of water. Do not consume any caffeinated/decaffeinated beverages or chocolate 12  hours prior to your test.   On the Day of the Test: Drink plenty of water until 1 hour prior to the test. Do not eat any food 4 hours prior to the test. You may take your regular medications prior to the test.  FEMALES- please wear underwire-free bra if available, avoid dresses & tight clothing        After the Test: Drink plenty of water. After receiving IV contrast, you may experience a mild flushed feeling. This is normal. On occasion, you may experience a mild rash up to 24 hours after the test. This is not dangerous. If this occurs, you can take Benadryl 25 mg and increase your fluid intake. If you experience trouble breathing, this can be serious. If it is severe call 911 IMMEDIATELY. If it is mild, please call our office.  Please allow 2-4 weeks for scheduling of routine cardiac  CTs. Some insurance companies require a pre-authorization which may delay scheduling of this test.   For non-scheduling related questions, please contact the cardiac imaging nurse navigator should you have any questions/concerns: Marchia Bond, Cardiac Imaging Nurse Navigator Gordy Clement, Cardiac Imaging Nurse Navigator  [facility] Services Direct Office Dial: [phone]   For scheduling needs, including cancellations and rescheduling, please call [place], [phone].     Follow-Up: At [facility], you and your health needs are our priority.  As part of our continuing mission to provide you with exceptional heart care, we have created designated Provider Care Teams.  These Care Teams include your primary Cardiologist (physician) and Advanced Practice Providers (APPs -  Physician Assistants and Nurse Practitioners) who all work together to provide you with the care you need, when you need it.  We recommend signing up for the patient portal called "MyChart".  Sign up information is provided on this After Visit Summary.  MyChart is used to connect with patients for Virtual Visits  (Telemedicine).  Patients are able to view lab/test results, encounter notes, upcoming appointments, etc.  Non-urgent messages can be sent to your provider as well.   To learn more about what you can do with MyChart, go to [email or web address].    Your next appointment:   4-6 week(s)  The format for your next appointment:   In Person  Provider:   You may see Glenetta Hew, MD or one of the following Advanced Practice Providers on your designated Care Team:   Murray Hodgkins, NP Christell Faith, PA-C Cadence Kathlen Mody, PA-C:1}    Other Instructions N/A    Glenetta Hew, M.D., M.S. Interventional Cardiologist   Pager # [phone] Phone # [phone] [address] [place],  [address]   Thank you for choosing [facility] in [place]!!

## 2021-05-08 NOTE — Assessment & Plan Note (Signed)
Relatively new onset (several months of what sounds like exertional chest discomfort which could be an anginal equivalent.  This is associated with significant dyspnea and now EKG showing left bundle branch block which is concerning for ischemic disease.  Plan: Check Coronary CTA (Myoview or GXT are not a good option given the bundle-branch block.

## 2021-05-08 NOTE — Assessment & Plan Note (Signed)
Poorly controlled lipids as of December.  And the patient is now has the pulmonary block and concerning symptoms for coronary ischemia, need to exclude CAD.  Thankfully, she does not have hypertension.  Low threshold to consider statin therapy, but will start off with Coronary CTA.  (This will not only show there is any ischemic CAD but also evaluate Coronary Calcium Score.)

## 2021-05-08 NOTE — Assessment & Plan Note (Signed)
Pretty significant exercise intolerance with exertional dyspnea.  Concerned this could be anginal or heart failure nature.  She does not have PND orthopnea, but definitely has fatigue and dyspnea.  Plan: Check 2D echo and Coronary CTA for ischemic evaluation.

## 2021-05-08 NOTE — Assessment & Plan Note (Signed)
Not sure how old this bundle branch block he has, but she clearly has had change in symptoms over the last couple months now with excessive exercise intolerance, fatigue, dyspnea and chest pain.  Need to exclude ischemic etiology but also need to exclude cardiomyopathy from bundle branch block.  Plan: We will check 2D echo and Coronary CTA (stress test would not be interpretable from EKG standpoint, and my fear would be that septal motion artifact would cause inaccurate reading of potential anterior defect)

## 2021-05-13 ENCOUNTER — Telehealth (HOSPITAL_COMMUNITY): Payer: Self-pay | Admitting: *Deleted

## 2021-05-13 ENCOUNTER — Other Ambulatory Visit (HOSPITAL_COMMUNITY): Payer: Self-pay | Admitting: *Deleted

## 2021-05-13 MED ORDER — METOPROLOL TARTRATE 100 MG PO TABS: ORAL_TABLET | ORAL | 0 refills | Status: DC

## 2021-05-13 NOTE — Telephone Encounter (Signed)
Reaching out to patient to offer assistance regarding upcoming cardiac imaging study; pt verbalizes understanding of appt date/time, parking situation and where to check in, pre-test NPO status and medications ordered, and verified current allergies; name and call back number provided for further questions should they arise  Gordy Clement RN Navigator Cardiac [place] [facility] [phone] office [phone] cell  Sent one tablet of 100mg  metoprolol tartrate to patient's pharmacy for patient to take two hours prior to her cardiac CT. She verbalized understanding.

## 2021-05-13 NOTE — Telephone Encounter (Signed)
Attempted to call patient regarding upcoming cardiac CT appointment. °Left message on voicemail with name and callback number ° °Tiana Sivertson RN Navigator Cardiac Imaging °[place] [facility] °[phone] Office °[phone] Cell ° °

## 2021-05-14 ENCOUNTER — Telehealth: Payer: Self-pay | Admitting: Cardiology

## 2021-05-14 NOTE — Telephone Encounter (Signed)
Patient states out of pocket is too high & cant meet deductible. Requesting it be sent to [facility] location in [place] phone [phone] fax [phone]. Requesting to be rescheduled, please assist

## 2021-05-15 ENCOUNTER — Ambulatory Visit: Admission: RE | Admit: 2021-05-15 | Payer: BC Managed Care – PPO | Source: Ambulatory Visit

## 2021-05-16 NOTE — Telephone Encounter (Signed)
CT done on 05/15/21. She called the day before procedure to request this change.

## 2021-05-19 ENCOUNTER — Other Ambulatory Visit: Payer: Self-pay

## 2021-05-19 ENCOUNTER — Other Ambulatory Visit (INDEPENDENT_AMBULATORY_CARE_PROVIDER_SITE_OTHER): Payer: BC Managed Care – PPO

## 2021-05-19 DIAGNOSIS — R079 Chest pain, unspecified: Secondary | ICD-10-CM | POA: Diagnosis not present

## 2021-05-20 LAB — COMPREHENSIVE METABOLIC PANEL
ALT: 19 IU/L (ref 0–32)
AST: 25 IU/L (ref 0–40)
Albumin/Globulin Ratio: 1.8 (ref 1.2–2.2)
Albumin: 4.8 g/dL (ref 3.8–4.9)
Alkaline Phosphatase: 65 IU/L (ref 44–121)
BUN/Creatinine Ratio: 25 — ABNORMAL HIGH (ref 9–23)
BUN: 16 mg/dL (ref 6–24)
Bilirubin Total: <0.2 mg/dL (ref 0.0–1.2)
CO2: 24 mmol/L (ref 20–29)
Calcium: 9.8 mg/dL (ref 8.7–10.2)
Chloride: 104 mmol/L (ref 96–106)
Creatinine, Ser: 0.65 mg/dL (ref 0.57–1.00)
Globulin, Total: 2.6 g/dL (ref 1.5–4.5)
Glucose: 90 mg/dL (ref 70–99)
Potassium: 4.7 mmol/L (ref 3.5–5.2)
Sodium: 141 mmol/L (ref 134–144)
Total Protein: 7.4 g/dL (ref 6.0–8.5)
eGFR: 102 mL/min/{1.73_m2} (ref >59)

## 2021-05-20 LAB — LIPID PANEL
Chol/HDL Ratio: 3.6 ratio (ref 0.0–4.4)
Cholesterol, Total: 229 mg/dL — ABNORMAL HIGH (ref 100–199)
HDL: 63 mg/dL (ref >39)
LDL Chol Calc (NIH): 140 mg/dL — ABNORMAL HIGH (ref 0–99)
Triglycerides: 148 mg/dL (ref 0–149)
VLDL Cholesterol Cal: 26 mg/dL (ref 5–40)

## 2021-05-20 NOTE — Telephone Encounter (Signed)
Called and spoke with Keane Scrape, representative for [facility]. Verified that [facility] [address] in [place] does in fact do cardiac CTs. Jess called specific location to verify that they do. Per Keane Scrape, all they need is to have an order faxed over.   Will have Dr. Ellyn Hack sign paper order when he's in office tomorrow and fax.

## 2021-05-20 NOTE — Telephone Encounter (Signed)
Called patient to speak with her regarding sending order to [facility]. No answer, lmtcb.

## 2021-05-20 NOTE — Telephone Encounter (Signed)
Spoke with patient.   Apologized for delay in call back regarding CT being sent to [facility]. Pt asked about echo, clarified that echo would be completed here in the office on 3/3 and that [facility] would contact her about the CT. Clarification offered on the difference between the two studies. Pt verbalized understanding.   Pt asked about labs drawn yesterday. Have not been resulted by MD yet, but went over them preliminarily with patient and told her that I would call her back if MD had any recommendations for changes based on her labs. Will also send information regarding diet changes via MyChart. Pt verbalized understanding.   Patient voiced appreciation for the call and understands that they can reach out to our office with any questions or concerns.

## 2021-05-21 ENCOUNTER — Encounter: Payer: Self-pay | Admitting: Family Medicine

## 2021-05-22 NOTE — Telephone Encounter (Signed)
Fax sent to [facility] with order for CT.

## 2021-05-23 ENCOUNTER — Other Ambulatory Visit: Payer: Self-pay | Admitting: Family Medicine

## 2021-05-23 DIAGNOSIS — E039 Hypothyroidism, unspecified: Secondary | ICD-10-CM

## 2021-05-30 ENCOUNTER — Ambulatory Visit (INDEPENDENT_AMBULATORY_CARE_PROVIDER_SITE_OTHER): Payer: BC Managed Care – PPO

## 2021-05-30 ENCOUNTER — Other Ambulatory Visit: Payer: Self-pay

## 2021-05-30 ENCOUNTER — Encounter (HOSPITAL_COMMUNITY): Payer: Self-pay

## 2021-05-30 ENCOUNTER — Telehealth (HOSPITAL_COMMUNITY): Payer: Self-pay | Admitting: Emergency Medicine

## 2021-05-30 DIAGNOSIS — R0609 Other forms of dyspnea: Secondary | ICD-10-CM

## 2021-05-30 LAB — ECHOCARDIOGRAM COMPLETE
AR max vel: 3.77 cm2
AV Area VTI: 3.34 cm2
AV Area mean vel: 3.34 cm2
AV Mean grad: 4 mmHg
AV Peak grad: 6.4 mmHg
Ao pk vel: 1.26 m/s
Area-P 1/2: 2.84 cm2
Calc EF: 72.9 %
S' Lateral: 2.1 cm
Single Plane A2C EF: 79.2 %
Single Plane A4C EF: 64.7 %

## 2021-05-30 NOTE — Telephone Encounter (Signed)
Attempted to call patient regarding upcoming cardiac CT appointment. °Left message on voicemail with name and callback number °Caitlan Chauca RN Navigator Cardiac Imaging °[place] [facility] °[phone] Office °[phone] Cell ° °

## 2021-05-30 NOTE — Telephone Encounter (Signed)
Pt returning phone call regarding upcoming cardiac imaging study; pt verbalizes understanding of appt date/time, parking situation and where to check in, pre-test NPO status and medications ordered, and verified current allergies; name and call back number provided for further questions should they arise ?Marchia Bond RN Navigator Cardiac Imaging ?[facility] ?[phone] office ?[phone] cell ? ?100mg  metoprolol tartrate  ?Denies iv issues ?Arrival 1215 ? ?

## 2021-06-02 ENCOUNTER — Ambulatory Visit: Admission: RE | Admit: 2021-06-02 | Payer: BC Managed Care – PPO | Source: Ambulatory Visit

## 2021-06-10 ENCOUNTER — Ambulatory Visit: Payer: BC Managed Care – PPO | Admitting: Family Medicine

## 2021-06-11 ENCOUNTER — Ambulatory Visit: Payer: BC Managed Care – PPO | Admitting: Nurse Practitioner

## 2021-06-17 DIAGNOSIS — M791 Myalgia, unspecified site: Secondary | ICD-10-CM | POA: Diagnosis not present

## 2021-06-17 DIAGNOSIS — E039 Hypothyroidism, unspecified: Secondary | ICD-10-CM | POA: Diagnosis not present

## 2021-07-04 DIAGNOSIS — J069 Acute upper respiratory infection, unspecified: Secondary | ICD-10-CM | POA: Diagnosis not present

## 2021-07-13 ENCOUNTER — Encounter (HOSPITAL_COMMUNITY): Payer: Self-pay

## 2021-07-17 ENCOUNTER — Ambulatory Visit (INDEPENDENT_AMBULATORY_CARE_PROVIDER_SITE_OTHER): Payer: BC Managed Care – PPO

## 2021-07-17 ENCOUNTER — Ambulatory Visit
Admission: RE | Admit: 2021-07-17 | Discharge: 2021-07-17 | Disposition: A | Discharge status: Home / Self Care | Payer: BC Managed Care – PPO | Source: Ambulatory Visit | Attending: Family Medicine | Admitting: Family Medicine

## 2021-07-17 VITALS — BP 127/86 | HR 77 | Temp 98.3°F | Resp 16

## 2021-07-17 DIAGNOSIS — R0602 Shortness of breath: Secondary | ICD-10-CM | POA: Diagnosis not present

## 2021-07-17 DIAGNOSIS — J4520 Mild intermittent asthma, uncomplicated: Secondary | ICD-10-CM

## 2021-07-17 DIAGNOSIS — R059 Cough, unspecified: Secondary | ICD-10-CM | POA: Diagnosis not present

## 2021-07-17 DIAGNOSIS — J40 Bronchitis, not specified as acute or chronic: Secondary | ICD-10-CM

## 2021-07-17 DIAGNOSIS — R5383 Other fatigue: Secondary | ICD-10-CM | POA: Diagnosis not present

## 2021-07-17 DIAGNOSIS — J440 Chronic obstructive pulmonary disease with acute lower respiratory infection: Secondary | ICD-10-CM

## 2021-07-17 MED ORDER — PREDNISONE 20 MG PO TABS: 40.0000 mg | ORAL_TABLET | Freq: Every day | ORAL | 0 refills | Status: DC

## 2021-07-17 NOTE — ED Provider Notes (Signed)
?[facility] ? ? ? ?CSN: [id number] ?Arrival date & time: 07/17/21  1012 ? ? ?  ? ?History   ?Chief Complaint ?Chief Complaint  ?Patient presents with  ? Cough  ?  chest and back sore from congestiongoing on 3 weeks - Entered by patient  ? ? ?HPI ?Sandra Chandler is a 61 y.o. female.  ? ?HPI ?Patient presents today for evaluation of cough and chest congestion x 2-3 weeks. Seen at PCP office on 07/04/21 treated for URI and prescribed Omnicef, Tessalon Perles and Tussionex.  Patient reports minimal relief of cough.  Patient denies a history of asthma , however endorses shortness of breath with persistent coughing. No known sick contacts. She is afebrile. ? ?Past Medical History:  ?Diagnosis Date  ? Chondromalacia of knee   ? Depressed bipolar I disorder ([place])   ? Herpes genitalis in women   ? Hx of basal cell carcinoma   ? Insomnia   ? Patellofemoral stress syndrome   ? Thyroid disease   ? Vitamin D deficiency   ? ? ?Patient Active Problem List  ? Diagnosis Date Noted  ? LBBB (left bundle branch block) 05/08/2021  ? Precordial pain 05/08/2021  ? DOE (dyspnea on exertion) 05/08/2021  ? Hyperlipidemia with target LDL less than 100 05/08/2021  ? Chondromalacia patellae 05/27/2017  ? Bipolar affective disorder, most recent episode unspecified type, remission status unspecified 03/20/2015  ? Major depressive disorder, recurrent, in full remission with anxious distress ([place]) 11/29/2014  ? Genital herpes 11/29/2014  ? Adult hypothyroidism 11/29/2014  ? Menopause 11/29/2014  ? Patella-femoral syndrome 11/29/2014  ? Overweight 11/29/2014  ? Pain in joint involving lower leg 11/29/2014  ? Personal history of other malignant neoplasm of skin 11/29/2014  ? History of basal cell carcinoma 01/10/2013  ? Basal cell carcinoma of skin of lip 01/10/2013  ? Vitamin D deficiency 09/18/2009  ? ? ?Past Surgical History:  ?Procedure Laterality Date  ? AUGMENTATION MAMMAPLASTY Bilateral   ? age 56 redone @ 5 years ago  ? BILATERAL  OOPHORECTOMY  05/2011  ? BREAST SURGERY  1984  ? CATARACT EXTRACTION W/PHACO Left 07/02/2020  ? Procedure: CATARACT EXTRACTION PHACO AND INTRAOCULAR LENS PLACEMENT ([place]) LEFT VIVITY 3.24 00:24.2;  Surgeon: Birder Robson, MD;  Location: [facility];  Service: Ophthalmology;  Laterality: Left;  ? CATARACT EXTRACTION W/PHACO Right 07/16/2020  ? Procedure: CATARACT EXTRACTION PHACO AND INTRAOCULAR LENS PLACEMENT ([place]) RIGHT VIVITY;  Surgeon: Birder Robson, MD;  Location: [facility];  Service: Ophthalmology;  Laterality: Right;  2.93 ?0:20.8  ? CESAREAN SECTION    ? x2  ? COLONOSCOPY WITH PROPOFOL N/A 02/03/2021  ? Procedure: COLONOSCOPY WITH PROPOFOL;  Surgeon: Jonathon Bellows, MD;  Location: [facility];  Service: Gastroenterology;  Laterality: N/A;  ? MOHS SURGERY  01/28/2013  ? ROTATOR CUFF REPAIR Right 10/12/2018  ? Dr. Katha Hamming  ? TUBAL LIGATION    ? ? ?OB History   ? ? Gravida  ?3  ? Para  ?2  ? Term  ?2  ? Preterm  ?   ? AB  ?1  ? Living  ?2  ?  ? ? SAB  ?   ? IAB  ?   ? Ectopic  ?   ? Multiple  ?   ? Live Births  ?   ?   ?  ? Obstetric Comments  ?2-cesareans  ?  ? ?  ? ? ? ?Home Medications   ? ?Prior to Admission medications   ?  Medication Sig Start Date End Date Taking? Authorizing Provider  ?busPIRone (BUSPAR) 5 MG tablet TAKE 1 TABLET BY MOUTH 2 TIMES DAILY AS NEEDED 03/11/21  Yes Sowles, Drue Stager, MD  ?estradiol (ESTRACE VAGINAL) 0.1 MG/GM vaginal cream Place 1 Applicatorful vaginally 3 (three) times a week. 03/12/21  Yes Sowles, Drue Stager, MD  ?hydrOXYzine (ATARAX) 10 MG tablet Take 1 tablet (10 mg total) by mouth at bedtime as needed. 03/11/21  Yes Sowles, Drue Stager, MD  ?levothyroxine (SYNTHROID) 50 MCG tablet TAKE 1 TABLET EVERY DAY ON EMPTY STOMACHWITH A GLASS OF WATER AT LEAST 30-60 MINBEFORE BREAKFAST 02/07/21  Yes Sowles, Drue Stager, MD  ?predniSONE (DELTASONE) 20 MG tablet Take 2 tablets (40 mg total) by mouth daily with breakfast. 07/17/21  Yes Scot Jun, FNP  ?albuterol  (VENTOLIN HFA) 108 (90 Base) MCG/ACT inhaler Inhale 1-2 puffs into the lungs every 4 (four) hours as needed for wheezing or shortness of breath. Use 2-3 times daily routinely presently, then return to as needed use as symptoms improve 03/11/21   Steele Sizer, MD  ?fluticasone (FLONASE) 50 MCG/ACT nasal spray Place 2 sprays into both nostrils daily. 10/30/19   Steele Sizer, MD  ?metoprolol tartrate (LOPRESSOR) 100 MG tablet Take 1 tablet ('100mg'$ ) TWO hours prior to your cardiac CT scan. 05/13/21   Leonie Man, MD  ?omeprazole (PRILOSEC) 40 MG capsule Take 1 capsule (40 mg total) by mouth daily. 03/11/21   Steele Sizer, MD  ?valACYclovir (VALTREX) 500 MG tablet TAKE 1 TABLET BY MOUTH DAILY.TAKE 3 TIMES DAILY AS NEEDED FOR OUTBREAK 11/14/20   Steele Sizer, MD  ? ? ?Family History ?Family History  ?Problem Relation Age of Onset  ? Osteoporosis Mother   ? Bipolar disorder Father   ? Cancer Father   ?     Lung  ? Bipolar disorder Brother   ? Breast cancer Maternal Grandmother 60  ? Melanoma Paternal Grandmother   ? ? ?Social History ?Social History  ? ?Tobacco Use  ? Smoking status: Never  ? Smokeless tobacco: Never  ?Vaping Use  ? Vaping Use: Never used  ?Substance Use Topics  ? Alcohol use: Yes  ?  Alcohol/week: 3.0 standard drinks  ?  Types: 1 Glasses of wine, 2 Cans of beer per week  ? Drug use: No  ? ? ? ?Allergies   ?Patient has no known allergies. ? ? ?Review of Systems ?Review of Systems ?Pertinent negatives listed in HPI  ? ?Physical Exam ?Triage Vital Signs ?ED Triage Vitals  ?Enc Vitals Group  ?   BP 07/17/21 1055 127/86  ?   Pulse Rate 07/17/21 1055 77  ?   Resp 07/17/21 1055 16  ?   Temp 07/17/21 1055 98.3 ?F (36.8 ?C)  ?   Temp Source 07/17/21 1055 Oral  ?   SpO2 07/17/21 1055 97 %  ?   Weight --   ?   Height --   ?   Head Circumference --   ?   Peak Flow --   ?   Pain Score 07/17/21 1056 3  ?   Pain Loc --   ?   Pain Edu? --   ?   Excl. in [place]? --   ? ?No data found. ? ?Updated Vital Signs ?BP  127/86 (BP Location: Left Arm)   Pulse 77   Temp 98.3 ?F (36.8 ?C) (Oral)   Resp 16   SpO2 97%  ? ?Visual Acuity ?Right Eye Distance:   ?Left Eye Distance:   ?Bilateral  Distance:   ? ?Right Eye Near:   ?Left Eye Near:    ?Bilateral Near:    ? ?Physical Exam ?Constitutional:   ?   Appearance: She is ill-appearing.  ?HENT:  ?   Head: Normocephalic and atraumatic.  ?   Nose: Congestion present.  ?   Mouth/Throat:  ?   Mouth: Mucous membranes are dry.  ?Eyes:  ?   Extraocular Movements: Extraocular movements intact.  ?   Pupils: Pupils are equal, round, and reactive to light.  ?Cardiovascular:  ?   Rate and Rhythm: Normal rate and regular rhythm.  ?Pulmonary:  ?   Effort: Pulmonary effort is normal.  ?   Breath sounds: Rhonchi present.  ?Skin: ?   General: Skin is warm.  ?   Capillary Refill: Capillary refill takes less than 2 seconds.  ?Neurological:  ?   General: No focal deficit present.  ?   Mental Status: She is alert.  ?Psychiatric:     ?   Mood and Affect: Mood normal.     ?   Behavior: Behavior normal.     ?   Thought Content: Thought content normal.     ?   Judgment: Judgment normal.  ? ? ? ?UC Treatments / Results  ?Labs ?(all labs ordered are listed, but only abnormal results are displayed) ?Labs Reviewed - No data to display ? ?EKG ? ? ?Radiology ?No results found. ? ?Procedures ?Procedures (including critical care time) ? ?Medications Ordered in UC ?Medications - No data to display ? ?Initial Impression / Assessment and Plan / UC Course  ?I have reviewed the triage vital signs and the nursing notes. ? ?Pertinent labs & imaging results that were available during my care of the patient were reviewed by me and considered in my medical decision making (see chart for details). ? ?Chest x-ray significant for chronic bronchitis.  Low suspicion for any bacterial etiology as patient does complete entire course of antibiotics.  Treatment today with prednisone, recommend continuation of Tessalon Perles and add  over-the-counter Delsym cough syrup.  Resume use albuterol inhaler.  Follow-up with primary care provider if symptoms worsen or do not readily improve with prescribed treatment. ?Final Clinical Impressions(s) / U

## 2021-07-17 NOTE — Discharge Instructions (Addendum)
Your chest x-ray is suggestive of chronic bronchitis.  Since she recently treated with antibiotics I am covering you with a brief course of prednisone which will decrease the inflammation in your lungs which is what is triggering your cough.  Resume use of your albuterol inhaler 2 puffs every 4 hours as needed for cyclic coughing, wheezing or shortness of breath.  Continue Tessalon Perles for management of cough and Delsym over-the-counter cough medication is also good to take in addition to the [employer].  If your symptoms do not improve following treatment of prednisone follow-up with your primary care provider. ?

## 2021-07-17 NOTE — ED Triage Notes (Signed)
Pt presents with cough, chest congestion x 2-3 weeks. Pt Back has started hurting due to cough.  ?

## 2021-07-24 ENCOUNTER — Ambulatory Visit: Payer: BC Managed Care – PPO | Admitting: Physician Assistant

## 2021-07-29 ENCOUNTER — Ambulatory Visit (INDEPENDENT_AMBULATORY_CARE_PROVIDER_SITE_OTHER): Payer: Self-pay | Admitting: Dermatology

## 2021-07-29 ENCOUNTER — Encounter: Payer: Self-pay | Admitting: Dermatology

## 2021-07-29 DIAGNOSIS — I781 Nevus, non-neoplastic: Secondary | ICD-10-CM

## 2021-07-29 DIAGNOSIS — L988 Other specified disorders of the skin and subcutaneous tissue: Secondary | ICD-10-CM

## 2021-07-29 NOTE — Patient Instructions (Addendum)
Discussed the treatment option of BBL/laser.  Typically we recommend 1-3 treatment sessions about 5-8 weeks apart for best results.  The patient's condition may require "maintenance treatments" in the future.  The fee for BBL / laser treatments is $350 per treatment session for the whole face.  A fee can be quoted for other parts of the body. ?Insurance typically does not pay for BBL/laser treatments and therefore the fee is an out-of-pocket cost. ? ? ? ? ?If You Need Anything After Your Visit ? ?If you have any questions or concerns for your doctor, please call our main line at [phone] and press option 4 to reach your doctor's medical assistant. If no one answers, please leave a voicemail as directed and we will return your call as soon as possible. Messages left after 4 pm will be answered the following business day.  ? ?You may also send [place] a message via MyChart. We typically respond to MyChart messages within 1-2 business days. ? ?For prescription refills, please ask your pharmacy to contact our office. Our fax number is [phone]. ? ?If you have an urgent issue when the clinic is closed that cannot wait until the next business day, you can page your doctor at the number below.   ? ?Please note that while we do our best to be available for urgent issues outside of office hours, we are not available 24/7.  ? ?If you have an urgent issue and are unable to reach [place], you may choose to seek medical care at your doctor's office, retail clinic, urgent care center, or emergency room. ? ?If you have a medical emergency, please immediately call 911 or go to the emergency department. ? ?Pager Numbers ? ?- Dr. Nehemiah Massed: [phone] ? ?- Dr. Laurence Ferrari: [phone] ? ?- Dr. Nicole Kindred: [phone] ? ?In the event of inclement weather, please call our main line at [phone] for an update on the status of any delays or closures. ? ?Dermatology Medication Tips: ?Please keep the boxes that topical medications come in in  order to help keep track of the instructions about where and how to use these. Pharmacies typically print the medication instructions only on the boxes and not directly on the medication tubes.  ? ?If your medication is too expensive, please contact our office at [phone] option 4 or send [place] a message through [facility].  ? ?We are unable to tell what your co-pay for medications will be in advance as this is different depending on your insurance coverage. However, we may be able to find a substitute medication at lower cost or fill out paperwork to get insurance to cover a needed medication.  ? ?If a prior authorization is required to get your medication covered by your insurance company, please allow [place] 1-2 business days to complete this process. ? ?Drug prices often vary depending on where the prescription is filled and some pharmacies may offer cheaper prices. ? ?The website www.goodrx.com contains coupons for medications through different pharmacies. The prices here do not account for what the cost may be with help from insurance (it may be cheaper with your insurance), but the website can give you the price if you did not use any insurance.  ?- You can print the associated coupon and take it with your prescription to the pharmacy.  ?- You may also stop by our office during regular business hours and pick up a GoodRx coupon card.  ?- If you need your prescription sent electronically to a different pharmacy, notify our  office through [facility] or by phone at [phone] option 4. ? ? ? ? ?Si Usted Necesita Algo Despu?s de Su Visita ? ?Tambi?n puede enviarnos un mensaje a trav?s de MyChart. Por lo general respondemos a los mensajes de MyChart en el transcurso de 1 a 2 d?as h?biles. ? ?Para renovar recetas, por favor pida a su farmacia que se ponga en contacto con nuestra oficina. Nuestro n?mero de fax es el [phone]. ? ?Si tiene un asunto urgente cuando la cl?nica est? cerrada y que no puede  esperar hasta el siguiente d?a h?bil, puede llamar/localizar a su doctor(a) al n?mero que aparece a continuaci?n.  ? ?Por favor, tenga en cuenta que aunque hacemos todo lo posible para estar disponibles para asuntos urgentes fuera del horario de oficina, no estamos disponibles las 24 horas del d?a, los 7 d?as de la semana.  ? ?Si tiene un problema urgente y no puede comunicarse con nosotros, puede optar por buscar atenci?n m?dica  en el consultorio de su doctor(a), en una cl?nica privada, en un centro de atenci?n urgente o en una sala de emergencias. ? ?Si tiene Engineer, maintenance (IT) m?dica, por favor llame inmediatamente al 911 o vaya a la sala de emergencias. ? ?N?meros de b?per ? ?- Dr. Nehemiah Massed: [phone] ? ?- Dra. Moye: [phone] ? ?- Dra. Nicole Kindred: [phone] ? ?En caso de inclemencias del tiempo, por favor llame a nuestra l?nea principal al [phone] para una actualizaci?n sobre el estado de cualquier retraso o cierre. ? ?Consejos para la medicaci?n en dermatolog?a: ?Por favor, guarde las cajas en las que vienen los medicamentos de uso t?pico para ayudarle a seguir las instrucciones sobre d?nde y c?mo usarlos. Las farmacias generalmente imprimen las instrucciones del medicamento s?lo en las cajas y no directamente en los tubos del [place].  ? ?Si su medicamento es muy caro, por favor, p?ngase en contacto con Zigmund Daniel llamando al [phone] y presione la opci?n 4 o env?enos un mensaje a trav?s de MyChart.  ? ?No podemos decirle cu?l ser? su copago por los medicamentos por adelantado ya que esto es diferente dependiendo de la cobertura de su seguro. Sin embargo, es posible que podamos encontrar un medicamento sustituto a Electrical engineer un formulario para que el seguro cubra el medicamento que se considera necesario.  ? ?Si se requiere Ardelia Mems autorizaci?n previa para que su compa??a de seguros [place] su medicamento, por favor perm?tanos de 1 a 2 d?as h?biles para completar este proceso. ? ?Los  precios de los medicamentos var?an con frecuencia dependiendo del Environmental consultant de d?nde se surte la receta y alguna farmacias pueden ofrecer precios m?s baratos. ? ?El sitio web www.goodrx.com tiene cupones para medicamentos de Airline pilot. Los precios aqu? no tienen en cuenta lo que podr?a costar con la ayuda del seguro (puede ser m?s barato con su seguro), pero el sitio web puede darle el precio si no utiliz? ning?n seguro.  ?- Puede imprimir el cup?n correspondiente y llevarlo con su receta a la farmacia.  ?- Tambi?n puede pasar por nuestra oficina durante el horario de atenci?n regular y recoger una tarjeta de cupones de GoodRx.  ?- Si necesita que su receta se env?e electr?nicamente a Chiropodist, informe a nuestra oficina a trav?s de MyChart de [place] o por tel?fono llamando al [phone] y presione la opci?n 4.  ?

## 2021-07-29 NOTE — Progress Notes (Signed)
? ?  Follow-Up Visit ?  ?Subjective  ?Sandra Chandler is a 61 y.o. female who presents for the following: Facial Elastosis (Here for Botox). ? ?The following portions of the chart were reviewed this encounter and updated as appropriate:  Tobacco  Allergies  Meds  Problems  Med Hx  Surg Hx  Fam Hx   ?  ?Review of Systems: No other skin or systemic complaints except as noted in HPI or Assessment and Plan. ? ?Objective  ?Well appearing patient in no apparent distress; mood and affect are within normal limits. ? ?A focused examination was performed including face. Relevant physical exam findings are noted in the Assessment and Plan. ? ?Head - Anterior (Face) ?Rhytides and volume loss.  ? ? ? ? ?face ?Scattered broken capillaries  ? ? ?Assessment & Plan  ?Elastosis of skin ?Head - Anterior (Face) ? ?Botox 40 units injected as marked: ?-Frown complex 27.5 units ?-Forehead 12.5 units  ? ?Botox Injection - Head - Anterior (Face) ?Location: See attached image ? ?Informed consent: Discussed risks (infection, pain, bleeding, bruising, swelling, allergic reaction, paralysis of nearby muscles, eyelid droop, double vision, neck weakness, difficulty breathing, headache, undesirable cosmetic result, and need for additional treatment) and benefits of the procedure, as well as the alternatives.  Informed consent was obtained. ? ?Preparation: The area was cleansed with alcohol. ? ?Procedure Details:  Botox was injected into the dermis with a 30-gauge needle. Pressure applied to any bleeding. Ice packs offered for swelling. ? ?Lot Number:  [id number] ?Expiration:  06/2023 ? ?Total Units Injected:  40 ? ?Plan: Patient was instructed to remain upright for 4 hours. Patient was instructed to avoid massaging the face and avoid vigorous exercise for the rest of the day. Tylenol may be used for headache.  Allow 2 weeks before returning to clinic for additional dosing as needed. Patient will call for any  problems. ? ?Telangiectasias ?face ?Discussed the treatment option of BBL/laser.  Typically we recommend 1-3 treatment sessions about 5-8 weeks apart for best results.  The patient's condition may require "maintenance treatments" in the future.  The fee for BBL / laser treatments is $350 per treatment session for the whole face.  A fee can be quoted for other parts of the body. ?Insurance typically does not pay for BBL/laser treatments and therefore the fee is an out-of-pocket cost. ? ?Return for Botox 3-4 months, BBL next available. ? ?I, Emelia Salisbury, CMA, am acting as scribe for Sarina Ser, MD. ?Documentation: I have reviewed the above documentation for accuracy and completeness, and I agree with the above. ? ?Sarina Ser, MD ? ? ?

## 2021-08-06 ENCOUNTER — Encounter: Payer: Self-pay | Admitting: Dermatology

## 2021-08-07 ENCOUNTER — Encounter (HOSPITAL_COMMUNITY): Payer: Self-pay

## 2021-11-04 ENCOUNTER — Ambulatory Visit (INDEPENDENT_AMBULATORY_CARE_PROVIDER_SITE_OTHER): Payer: Self-pay | Admitting: Dermatology

## 2021-11-04 ENCOUNTER — Encounter: Payer: Self-pay | Admitting: Dermatology

## 2021-11-04 DIAGNOSIS — L988 Other specified disorders of the skin and subcutaneous tissue: Secondary | ICD-10-CM

## 2021-11-04 NOTE — Progress Notes (Signed)
    Follow-Up Visit   Subjective  Sandra Chandler is a 61 y.o. female who presents for the following: Facial Elastosis (Patient is here today for Botox injections).  The following portions of the chart were reviewed this encounter and updated as appropriate:   Tobacco  Allergies  Meds  Problems  Med Hx  Surg Hx  Fam Hx     Review of Systems:  No other skin or systemic complaints except as noted in HPI or Assessment and Plan.  Objective  Well appearing patient in no apparent distress; mood and affect are within normal limits.  A focused examination was performed including the face. Relevant physical exam findings are noted in the Assessment and Plan.  Face Rhytides and volume loss.                    Assessment & Plan  Elastosis of skin Face  Due to dropped brows we decreased the amount of units in the forehead from 12.5 to 5 units. I also added a B/L brow lift of 2.5 units each for a total of 5 units each.   Botox 37.5 units injected as marked:  - Frown complex 27.5 units - Brow lift 2.5 units each for a total of 5 units - Forehead 5 units   B/L brow lift injected 1.0 cm above the brow at the mid pupillary line. Forehead injections were performed 3.0 cm above the brow.  Botox Injection - Face Location: See attached image  Informed consent: Discussed risks (infection, pain, bleeding, bruising, swelling, allergic reaction, paralysis of nearby muscles, eyelid droop, double vision, neck weakness, difficulty breathing, headache, undesirable cosmetic result, and need for additional treatment) and benefits of the procedure, as well as the alternatives.  Informed consent was obtained.  Preparation: The area was cleansed with alcohol.  Procedure Details:  Botox was injected into the dermis with a 30-gauge needle. Pressure applied to any bleeding. Ice packs offered for swelling.  Lot Number:  [id number] Expiration:  11/2023  Total Units Injected:   37.5  Plan: Patient was instructed to remain upright for 4 hours. Patient was instructed to avoid massaging the face and avoid vigorous exercise for the rest of the day. Tylenol may be used for headache.  Allow 2 weeks before returning to clinic for additional dosing as needed. Patient will call for any problems.  Return in about 3 months (around 02/04/2022) for Botox injections.  Luther Redo, CMA, am acting as scribe for Sarina Ser, MD . Documentation: I have reviewed the above documentation for accuracy and completeness, and I agree with the above.  Sarina Ser, MD

## 2021-11-04 NOTE — Patient Instructions (Signed)
Due to recent changes in healthcare laws, you may see results of your pathology and/or laboratory studies on MyChart before the doctors have had a chance to review them. We understand that in some cases there may be results that are confusing or concerning to you. Please understand that not all results are received at the same time and often the doctors may need to interpret multiple results in order to provide you with the best plan of care or course of treatment. Therefore, we ask that you please give us 2 business days to thoroughly review all your results before contacting the office for clarification. Should we see a critical lab result, you will be contacted sooner.   If You Need Anything After Your Visit  If you have any questions or concerns for your doctor, please call our main line at [phone] and press option 4 to reach your doctor's medical assistant. If no one answers, please leave a voicemail as directed and we will return your call as soon as possible. Messages left after 4 pm will be answered the following business day.   You may also send us a message via MyChart. We typically respond to MyChart messages within 1-2 business days.  For prescription refills, please ask your pharmacy to contact our office. Our fax number is [phone].  If you have an urgent issue when the clinic is closed that cannot wait until the next business day, you can page your doctor at the number below.    Please note that while we do our best to be available for urgent issues outside of office hours, we are not available 24/7.   If you have an urgent issue and are unable to reach us, you may choose to seek medical care at your doctor's office, retail clinic, urgent care center, or emergency room.  If you have a medical emergency, please immediately call 911 or go to the emergency department.  Pager Numbers  - Dr. Kowalski: [phone]  - Dr. Moye: [phone]  - Dr. Stewart:  [phone]  In the event of inclement weather, please call our main line at [phone] for an update on the status of any delays or closures.  Dermatology Medication Tips: Please keep the boxes that topical medications come in in order to help keep track of the instructions about where and how to use these. Pharmacies typically print the medication instructions only on the boxes and not directly on the medication tubes.   If your medication is too expensive, please contact our office at [phone] option 4 or send us a message through MyChart.   We are unable to tell what your co-pay for medications will be in advance as this is different depending on your insurance coverage. However, we may be able to find a substitute medication at lower cost or fill out paperwork to get insurance to cover a needed medication.   If a prior authorization is required to get your medication covered by your insurance company, please allow us 1-2 business days to complete this process.  Drug prices often vary depending on where the prescription is filled and some pharmacies may offer cheaper prices.  The website www.goodrx.com contains coupons for medications through different pharmacies. The prices here do not account for what the cost may be with help from insurance (it may be cheaper with your insurance), but the website can give you the price if you did not use any insurance.  - You can print the associated coupon and take it with   your prescription to the pharmacy.  - You may also stop by our office during regular business hours and pick up a GoodRx coupon card.  - If you need your prescription sent electronically to a different pharmacy, notify our office through [facility] MyChart or by phone at [phone] option 4.     Si Usted Necesita Algo Despus de Su Visita  Tambin puede enviarnos un mensaje a travs de MyChart. Por lo general respondemos a los mensajes de MyChart en el transcurso de 1 a 2  das hbiles.  Para renovar recetas, por favor pida a su farmacia que se ponga en contacto con nuestra oficina. Nuestro nmero de fax es el [phone].  Si tiene un asunto urgente cuando la clnica est cerrada y que no puede esperar hasta el siguiente da hbil, puede llamar/localizar a su doctor(a) al nmero que aparece a continuacin.   Por favor, tenga en cuenta que aunque hacemos todo lo posible para estar disponibles para asuntos urgentes fuera del horario de oficina, no estamos disponibles las 24 horas del da, los 7 das de la semana.   Si tiene un problema urgente y no puede comunicarse con nosotros, puede optar por buscar atencin mdica  en el consultorio de su doctor(a), en una clnica privada, en un centro de atencin urgente o en una sala de emergencias.  Si tiene una emergencia mdica, por favor llame inmediatamente al 911 o vaya a la sala de emergencias.  Nmeros de bper  - Dr. Kowalski: [phone]  - Dra. Moye: [phone]  - Dra. Stewart: [phone]  En caso de inclemencias del tiempo, por favor llame a nuestra lnea principal al [phone] para una actualizacin sobre el estado de cualquier retraso o cierre.  Consejos para la medicacin en dermatologa: Por favor, guarde las cajas en las que vienen los medicamentos de uso tpico para ayudarle a seguir las instrucciones sobre dnde y cmo usarlos. Las farmacias generalmente imprimen las instrucciones del medicamento slo en las cajas y no directamente en los tubos del medicamento.   Si su medicamento es muy caro, por favor, pngase en contacto con nuestra oficina llamando al [phone] y presione la opcin 4 o envenos un mensaje a travs de MyChart.   No podemos decirle cul ser su copago por los medicamentos por adelantado ya que esto es diferente dependiendo de la cobertura de su seguro. Sin embargo, es posible que podamos encontrar un medicamento sustituto a menor costo o llenar un formulario para que el  seguro cubra el medicamento que se considera necesario.   Si se requiere una autorizacin previa para que su compaa de seguros cubra su medicamento, por favor permtanos de 1 a 2 das hbiles para completar este proceso.  Los precios de los medicamentos varan con frecuencia dependiendo del lugar de dnde se surte la receta y alguna farmacias pueden ofrecer precios ms baratos.  El sitio web www.goodrx.com tiene cupones para medicamentos de diferentes farmacias. Los precios aqu no tienen en cuenta lo que podra costar con la ayuda del seguro (puede ser ms barato con su seguro), pero el sitio web puede darle el precio si no utiliz ningn seguro.  - Puede imprimir el cupn correspondiente y llevarlo con su receta a la farmacia.  - Tambin puede pasar por nuestra oficina durante el horario de atencin regular y recoger una tarjeta de cupones de GoodRx.  - Si necesita que su receta se enve electrnicamente a una farmacia diferente, informe a nuestra oficina a travs de MyChart de [place]   o por telfono llamando al [phone] y presione la opcin 4.  

## 2021-12-12 DIAGNOSIS — H5213 Myopia, bilateral: Secondary | ICD-10-CM | POA: Diagnosis not present

## 2021-12-12 DIAGNOSIS — H52223 Regular astigmatism, bilateral: Secondary | ICD-10-CM | POA: Diagnosis not present

## 2021-12-12 DIAGNOSIS — H538 Other visual disturbances: Secondary | ICD-10-CM | POA: Diagnosis not present

## 2021-12-12 DIAGNOSIS — H524 Presbyopia: Secondary | ICD-10-CM | POA: Diagnosis not present

## 2022-02-03 ENCOUNTER — Other Ambulatory Visit: Payer: Self-pay | Admitting: Family Medicine

## 2022-02-03 DIAGNOSIS — A6000 Herpesviral infection of urogenital system, unspecified: Secondary | ICD-10-CM

## 2022-02-03 DIAGNOSIS — B001 Herpesviral vesicular dermatitis: Secondary | ICD-10-CM

## 2022-02-10 ENCOUNTER — Ambulatory Visit: Payer: BC Managed Care – PPO | Admitting: Dermatology

## 2022-02-11 ENCOUNTER — Ambulatory Visit: Payer: BC Managed Care – PPO | Admitting: Family Medicine

## 2022-02-11 ENCOUNTER — Encounter: Payer: Self-pay | Admitting: Family Medicine

## 2022-02-11 VITALS — BP 120/81 | HR 96 | Temp 98.9°F | Ht 64.75 in | Wt 181.4 lb

## 2022-02-11 DIAGNOSIS — J029 Acute pharyngitis, unspecified: Secondary | ICD-10-CM

## 2022-02-11 DIAGNOSIS — E039 Hypothyroidism, unspecified: Secondary | ICD-10-CM | POA: Diagnosis not present

## 2022-02-11 DIAGNOSIS — E559 Vitamin D deficiency, unspecified: Secondary | ICD-10-CM | POA: Diagnosis not present

## 2022-02-11 DIAGNOSIS — E785 Hyperlipidemia, unspecified: Secondary | ICD-10-CM | POA: Diagnosis not present

## 2022-02-11 DIAGNOSIS — R350 Frequency of micturition: Secondary | ICD-10-CM

## 2022-02-11 LAB — URINALYSIS, ROUTINE W REFLEX MICROSCOPIC
Bilirubin, UA: NEGATIVE
Glucose, UA: NEGATIVE
Ketones, UA: NEGATIVE
Leukocytes,UA: NEGATIVE
Nitrite, UA: NEGATIVE
Protein,UA: NEGATIVE
RBC, UA: NEGATIVE
Specific Gravity, UA: 1.01 (ref 1.005–1.030)
Urobilinogen, Ur: 0.2 mg/dL (ref 0.2–1.0)
pH, UA: 5.5 (ref 5.0–7.5)

## 2022-02-11 LAB — VERITOR FLU A/B WAIVED
Influenza A: NEGATIVE
Influenza B: NEGATIVE

## 2022-02-11 NOTE — Progress Notes (Unsigned)
BP 120/81   Pulse 96   Temp 98.9 F (37.2 C) (Oral)   Ht 5' 4.75" (1.645 m)   Wt 181 lb 6.4 oz (82.3 kg)   SpO2 98%   BMI 30.42 kg/m    Subjective:    Patient ID: Sandra Chandler, female    DOB: Nov 09, 1960, 61 y.o.   MRN: [id number]  HPI: Sandra Chandler is a 61 y.o. female who presents today to establish care  Chief Complaint  Patient presents with   New Patient (Initial Visit)    Patient is here for New Patient to Establish Care.    Hypothyroidism    Patient says she has a history of low thyroid and wants to have the provider to check at today's visit.    UPPER RESPIRATORY TRACT INFECTION Duration: 1 day Worst symptom: sore throat Fever: no Cough: no Shortness of breath: no Wheezing: no Chest pain: no Chest tightness: yes Chest congestion: no Nasal congestion: yes Runny nose: yes Post nasal drip: yes Sneezing: no Sore throat: yes Swollen glands: no Sinus pressure: yes Headache: no Face pain: no Toothache: no Ear pain: no  Ear pressure: yes bilateral Eyes red/itching:no Eye drainage/crusting: no  Vomiting: no Rash: no Fatigue: yes Sick contacts: yes Strep contacts: no  Context: worse Recurrent sinusitis: no Relief with OTC cold/cough medications: no  Treatments attempted: sudafed, tylenol   HYPOTHYROIDISM Thyroid control status: unsure Satisfied with current treatment? unsure Medication side effects: no Medication compliance: excellent compliance Recent dose adjustment:no Fatigue: yes Cold intolerance: yes Heat intolerance: no Weight gain: no Weight loss: no Constipation: no Diarrhea/loose stools: no Palpitations: no Lower extremity edema: no Anxiety/depressed mood: yes  ANXIETY/STRESS Duration: chronic, but much worse Status:exacerbated Anxious mood: yes  Excessive worrying: yes Irritability: yes  Sweating: no Nausea: yes Palpitations:yes Hyperventilation: no Panic attacks: no Agoraphobia: no  Obscessions/compulsions:  no Depressed mood: no    03/11/2021    8:01 AM 09/19/2020    2:11 PM 05/03/2020    3:26 PM 12/13/2019    8:47 AM 10/30/2019    9:17 AM  Depression screen PHQ 2/9  Decreased Interest 0 0 0 0 0  Down, Depressed, Hopeless 0 0 0 0 0  PHQ - 2 Score 0 0 0 0 0  Altered sleeping 0 0 1  1  Tired, decreased energy 0 0 0  1  Change in appetite 0 0 0  0  Feeling bad or failure about yourself  0 0 0  0  Trouble concentrating 0 0 1  1  Moving slowly or fidgety/restless 0 0 0  1  Suicidal thoughts 0 0 0  0  PHQ-9 Score 0 0 2  4  Difficult doing work/chores  Not difficult at all   Somewhat difficult   Anhedonia: no Weight changes: no Insomnia: yes- staying asleep   Hypersomnia: no Fatigue/loss of energy: yes Feelings of worthlessness: no Feelings of guilt: no Impaired concentration/indecisiveness: no Suicidal ideations: no  Crying spells: no Recent Stressors/Life Changes: yes   Relationship problems: yes   Family stress: yes     Financial stress: yes    Job stress: no    Recent death/loss: no   Active Ambulatory Problems    Diagnosis Date Noted   Major depressive disorder, recurrent, in full remission with anxious distress ([place]) 11/29/2014   Genital herpes 11/29/2014   Adult hypothyroidism 11/29/2014   Menopause 11/29/2014   Patella-femoral syndrome 11/29/2014   Vitamin D deficiency 09/18/2009   Overweight 11/29/2014  Bipolar affective disorder, most recent episode unspecified type, remission status unspecified 03/20/2015   History of basal cell carcinoma 01/10/2013   Chondromalacia patellae 05/27/2017   Basal cell carcinoma of skin of lip 01/10/2013   Pain in joint involving lower leg 11/29/2014   Personal history of other malignant neoplasm of skin 11/29/2014   LBBB (left bundle branch block) 05/08/2021   Precordial pain 05/08/2021   DOE (dyspnea on exertion) 05/08/2021   Hyperlipidemia with target LDL less than 100 05/08/2021   Resolved Ambulatory Problems    Diagnosis  Date Noted   No Resolved Ambulatory Problems   Past Medical History:  Diagnosis Date   Chondromalacia of knee    Depressed bipolar I disorder (HCC)    Herpes genitalis in women    Hx of basal cell carcinoma    Insomnia    Patellofemoral stress syndrome    Thyroid disease    Past Surgical History:  Procedure Laterality Date   AUGMENTATION MAMMAPLASTY Bilateral    age 38 redone @ 5 years ago   BILATERAL OOPHORECTOMY  05/2011   BREAST SURGERY  1984   CATARACT EXTRACTION W/PHACO Left 07/02/2020   Procedure: CATARACT EXTRACTION PHACO AND INTRAOCULAR LENS PLACEMENT ([place]) LEFT VIVITY 3.24 00:24.2;  Surgeon: Birder Robson, MD;  Location: [facility];  Service: Ophthalmology;  Laterality: Left;   CATARACT EXTRACTION W/PHACO Right 07/16/2020   Procedure: CATARACT EXTRACTION PHACO AND INTRAOCULAR LENS PLACEMENT ([place]) RIGHT VIVITY;  Surgeon: Birder Robson, MD;  Location: [facility];  Service: Ophthalmology;  Laterality: Right;  2.93 0:20.8   CESAREAN SECTION     x2   COLONOSCOPY WITH PROPOFOL N/A 02/03/2021   Procedure: COLONOSCOPY WITH PROPOFOL;  Surgeon: Jonathon Bellows, MD;  Location: [facility] ENDOSCOPY;  Service: Gastroenterology;  Laterality: N/A;   MOHS SURGERY  01/28/2013   ROTATOR CUFF REPAIR Right 10/12/2018   Dr. Katha Hamming   TUBAL LIGATION     Outpatient Encounter Medications as of 02/11/2022  Medication Sig   albuterol (VENTOLIN HFA) 108 (90 Base) MCG/ACT inhaler Inhale 1-2 puffs into the lungs every 4 (four) hours as needed for wheezing or shortness of breath. Use 2-3 times daily routinely presently, then return to as needed use as symptoms improve   busPIRone (BUSPAR) 5 MG tablet TAKE 1 TABLET BY MOUTH 2 TIMES DAILY AS NEEDED   estradiol (ESTRACE VAGINAL) 0.1 MG/GM vaginal cream Place 1 Applicatorful vaginally 3 (three) times a week.   levothyroxine (SYNTHROID) 50 MCG tablet TAKE 1 TABLET EVERY DAY ON EMPTY STOMACHWITH A GLASS OF WATER AT LEAST 30-60  MINBEFORE BREAKFAST   omeprazole (PRILOSEC) 40 MG capsule Take 1 capsule (40 mg total) by mouth daily.   valACYclovir (VALTREX) 500 MG tablet TAKE 1 TABLET BY MOUTH DAILY.TAKE 3 TIMES DAILY AS NEEDED FOR OUTBREAK   hydrOXYzine (ATARAX) 10 MG tablet Take 1 tablet (10 mg total) by mouth at bedtime as needed. (Patient not taking: Reported on 02/11/2022)   metoprolol tartrate (LOPRESSOR) 100 MG tablet Take 1 tablet ('100mg'$ ) TWO hours prior to your cardiac CT scan.   predniSONE (DELTASONE) 20 MG tablet Take 2 tablets (40 mg total) by mouth daily with breakfast. (Patient not taking: Reported on 02/11/2022)   [DISCONTINUED] fluticasone (FLONASE) 50 MCG/ACT nasal spray Place 2 sprays into both nostrils daily.   No facility-administered encounter medications on file as of 02/11/2022.   No Known Allergies  Social History   Socioeconomic History   Marital status: Divorced    Spouse name: Not on file  Number of children: 2   Years of education: Not on file   Highest education level: 12th grade  Occupational History   Occupation: purchasing     Comment: [employer]  Tobacco Use   Smoking status: Never   Smokeless tobacco: Never  Vaping Use   Vaping Use: Never used  Substance and Sexual Activity   Alcohol use: Yes    Alcohol/week: 3.0 standard drinks of alcohol    Types: 1 Glasses of wine, 2 Cans of beer per week   Drug use: No   Sexual activity: Not Currently    Partners: Male    Comment: dating, but boyfriend ED  Other Topics Concern   Not on file  Social History Narrative   Son lives with her. .  Divorced, has two grown children.    Social Determinants of Health   Financial Resource Strain: Low Risk  (10/30/2019)   Overall Financial Resource Strain (CARDIA)    Difficulty of Paying Living Expenses: Not hard at all  Food Insecurity: No Food Insecurity (10/30/2019)   Hunger Vital Sign    Worried About Running Out of Food in the Last Year: Never true    Ran Out of Food in the Last Year:  Never true  Transportation Needs: No Transportation Needs (10/30/2019)   PRAPARE - Hydrologist (Medical): No    Lack of Transportation (Non-Medical): No  Physical Activity: Sufficiently Active (10/30/2019)   Exercise Vital Sign    Days of Exercise per Week: 4 days    Minutes of Exercise per Session: 60 min  Stress: Stress Concern Present (10/30/2019)   [place]    Feeling of Stress : Very much  Social Connections: Moderately Integrated (10/30/2019)   Social Connection and Isolation Panel [NHANES]    Frequency of Communication with Friends and Family: More than three times a week    Frequency of Social Gatherings with Friends and Family: More than three times a week    Attends Religious Services: More than 4 times per year    Active Member of [employer] or Organizations: Yes    Attends Music therapist: More than 4 times per year    Marital Status: Divorced   Family History  Problem Relation Age of Onset   Osteoporosis Mother    Bipolar disorder Father    Cancer Father        Lung   Bipolar disorder Brother    Breast cancer Maternal Grandmother 92   Melanoma Paternal Grandmother     Review of Systems  Per HPI unless specifically indicated above     Objective:    BP 120/81   Pulse 96   Temp 98.9 F (37.2 C) (Oral)   Ht 5' 4.75" (1.645 m)   Wt 181 lb 6.4 oz (82.3 kg)   SpO2 98%   BMI 30.42 kg/m   Wt Readings from Last 3 Encounters:  02/11/22 181 lb 6.4 oz (82.3 kg)  05/08/21 178 lb (80.7 kg)  03/11/21 175 lb (79.4 kg)    Physical Exam  Results for orders placed or performed in visit on 05/30/21  ECHOCARDIOGRAM COMPLETE  Result Value Ref Range   AR max vel 3.77 cm2   AV Peak grad 6.4 mmHg   Ao pk vel 1.26 m/s   S' Lateral 2.10 cm   Area-P 1/2 2.84 cm2   AV Area VTI 3.34 cm2   AV Mean grad 4.0 mmHg  Single Plane A4C EF 64.7 %   Single Plane A2C EF 79.2 %    Calc EF 72.9 %   AV Area mean vel 3.34 cm2      Assessment & Plan:   Problem List Items Addressed This Visit   None    Follow up plan: No follow-ups on file.

## 2022-02-12 LAB — CBC WITH DIFFERENTIAL/PLATELET
Basophils Absolute: 0.1 10*3/uL (ref 0.0–0.2)
Basos: 0 %
EOS (ABSOLUTE): 0.1 10*3/uL (ref 0.0–0.4)
Eos: 1 %
Hematocrit: 38 % (ref 34.0–46.6)
Hemoglobin: 12.9 g/dL (ref 11.1–15.9)
Immature Grans (Abs): 0 10*3/uL (ref 0.0–0.1)
Immature Granulocytes: 0 %
Lymphocytes Absolute: 1.1 10*3/uL (ref 0.7–3.1)
Lymphs: 8 %
MCH: 30.4 pg (ref 26.6–33.0)
MCHC: 33.9 g/dL (ref 31.5–35.7)
MCV: 89 fL (ref 79–97)
Monocytes Absolute: 1.1 10*3/uL — ABNORMAL HIGH (ref 0.1–0.9)
Monocytes: 8 %
Neutrophils Absolute: 11.3 10*3/uL — ABNORMAL HIGH (ref 1.4–7.0)
Neutrophils: 83 %
Platelets: 335 10*3/uL (ref 150–450)
RBC: 4.25 x10E6/uL (ref 3.77–5.28)
RDW: 13 % (ref 11.7–15.4)
WBC: 13.7 10*3/uL — ABNORMAL HIGH (ref 3.4–10.8)

## 2022-02-12 LAB — COMPREHENSIVE METABOLIC PANEL
ALT: 25 IU/L (ref 0–32)
AST: 36 IU/L (ref 0–40)
Albumin/Globulin Ratio: 1.5 (ref 1.2–2.2)
Albumin: 4.8 g/dL (ref 3.8–4.9)
Alkaline Phosphatase: 73 IU/L (ref 44–121)
BUN/Creatinine Ratio: 11 (ref 9–23)
BUN: 8 mg/dL (ref 6–24)
Bilirubin Total: 0.3 mg/dL (ref 0.0–1.2)
CO2: 20 mmol/L (ref 20–29)
Calcium: 9.9 mg/dL (ref 8.7–10.2)
Chloride: 101 mmol/L (ref 96–106)
Creatinine, Ser: 0.71 mg/dL (ref 0.57–1.00)
Globulin, Total: 3.1 g/dL (ref 1.5–4.5)
Glucose: 84 mg/dL (ref 70–99)
Potassium: 4.4 mmol/L (ref 3.5–5.2)
Sodium: 140 mmol/L (ref 134–144)
Total Protein: 7.9 g/dL (ref 6.0–8.5)
eGFR: 98 mL/min/{1.73_m2} (ref >59)

## 2022-02-12 LAB — LIPID PANEL W/O CHOL/HDL RATIO
Cholesterol, Total: 229 mg/dL — ABNORMAL HIGH (ref 100–199)
HDL: 65 mg/dL (ref >39)
LDL Chol Calc (NIH): 141 mg/dL — ABNORMAL HIGH (ref 0–99)
Triglycerides: 129 mg/dL (ref 0–149)
VLDL Cholesterol Cal: 23 mg/dL (ref 5–40)

## 2022-02-12 LAB — VITAMIN D 25 HYDROXY (VIT D DEFICIENCY, FRACTURES): Vit D, 25-Hydroxy: 28 ng/mL — ABNORMAL LOW (ref 30.0–100.0)

## 2022-02-12 LAB — NOVEL CORONAVIRUS, NAA: SARS-CoV-2, NAA: NOT DETECTED

## 2022-02-12 LAB — TSH: TSH: 0.989 u[IU]/mL (ref 0.450–4.500)

## 2022-02-12 NOTE — Assessment & Plan Note (Signed)
Rechecking labs today. Await results. Treat as needed.  °

## 2022-02-12 NOTE — Assessment & Plan Note (Signed)
Reechecking labs today. Await results. Treat as needed.

## 2022-02-14 ENCOUNTER — Encounter: Payer: Self-pay | Admitting: Family Medicine

## 2022-02-16 ENCOUNTER — Encounter: Payer: Self-pay | Admitting: Family Medicine

## 2022-02-16 ENCOUNTER — Telehealth (INDEPENDENT_AMBULATORY_CARE_PROVIDER_SITE_OTHER): Payer: BC Managed Care – PPO | Admitting: Nurse Practitioner

## 2022-02-16 ENCOUNTER — Encounter: Payer: Self-pay | Admitting: Nurse Practitioner

## 2022-02-16 DIAGNOSIS — J0110 Acute frontal sinusitis, unspecified: Secondary | ICD-10-CM | POA: Diagnosis not present

## 2022-02-16 MED ORDER — AMOXICILLIN 500 MG PO CAPS: 500.0000 mg | ORAL_CAPSULE | Freq: Two times a day (BID) | ORAL | 0 refills | Status: AC

## 2022-02-16 NOTE — Progress Notes (Signed)
There were no vitals taken for this visit.   Subjective:    Patient ID: Sandra Chandler, female    DOB: November 23, 1960, 61 y.o.   MRN: [id number]  HPI: Sandra Chandler is a 61 y.o. female  Chief Complaint  Patient presents with   Sinus Problem    Sinus pressure not improving per patient x 1 week. OTC mucinex, pseudafed, nasal spray and nyquil.   UPPER RESPIRATORY TRACT INFECTION Worst symptom: Symptoms have been ongoing x 1 week Fever: no Cough: yes Shortness of breath: yes Wheezing: no Chest pain: no Chest tightness: yes Chest congestion: no Nasal congestion: yes Runny nose: yes Post nasal drip: yes Sneezing: yes Sore throat: yes Swollen glands: no Sinus pressure: yes Headache: yes Face pain: no Toothache: no Ear pain: no bilateral Ear pressure: yes bilateral Eyes red/itching:yes Eye drainage/crusting: no  Vomiting: no Rash: no Fatigue: yes Sick contacts: no Strep contacts: no  Context: stable Recurrent sinusitis: no Relief with OTC cold/cough medications: yes  Treatments attempted: mucinex and pseudoephedrine   Relevant past medical, surgical, family and social history reviewed and updated as indicated. Interim medical history since our last visit reviewed. Allergies and medications reviewed and updated.  Review of Systems  Constitutional:  Positive for fatigue. Negative for fever.  HENT:  Positive for congestion, postnasal drip, rhinorrhea, sinus pressure, sinus pain, sneezing and sore throat. Negative for dental problem and ear pain.   Respiratory:  Positive for cough, chest tightness and shortness of breath. Negative for wheezing.   Cardiovascular:  Negative for chest pain.  Gastrointestinal:  Negative for vomiting.  Skin:  Negative for rash.  Neurological:  Positive for headaches.    Per HPI unless specifically indicated above     Objective:    There were no vitals taken for this visit.  Wt Readings from Last 3 Encounters:  02/11/22 181 lb 6.4  oz (82.3 kg)  05/08/21 178 lb (80.7 kg)  03/11/21 175 lb (79.4 kg)    Physical Exam Vitals and nursing note reviewed.  Constitutional:      General: She is not in acute distress.    Appearance: She is not ill-appearing.  HENT:     Head: Normocephalic.     Right Ear: Hearing normal.     Left Ear: Hearing normal.     Nose: Nose normal.  Pulmonary:     Effort: Pulmonary effort is normal. No respiratory distress.  Neurological:     Mental Status: She is alert.  Psychiatric:        Mood and Affect: Mood normal.        Behavior: Behavior normal.        Thought Content: Thought content normal.        Judgment: Judgment normal.     Results for orders placed or performed in visit on 02/11/22  Novel Coronavirus, NAA ([facility])   Specimen: Nasopharyngeal(NP) swabs in vial transport medium  Result Value Ref Range   SARS-CoV-2, NAA Not Detected Not Detected  CBC with Differential/Platelet  Result Value Ref Range   WBC 13.7 (H) 3.4 - 10.8 x10E3/uL   RBC 4.25 3.77 - 5.28 x10E6/uL   Hemoglobin 12.9 11.1 - 15.9 g/dL   Hematocrit 38.0 34.0 - 46.6 %   MCV 89 79 - 97 fL   MCH 30.4 26.6 - 33.0 pg   MCHC 33.9 31.5 - 35.7 g/dL   RDW 13.0 11.7 - 15.4 %   Platelets 335 150 - 450 x10E3/uL   Neutrophils 83  Not Estab. %   Lymphs 8 Not Estab. %   Monocytes 8 Not Estab. %   Eos 1 Not Estab. %   Basos 0 Not Estab. %   Neutrophils Absolute 11.3 (H) 1.4 - 7.0 x10E3/uL   Lymphocytes Absolute 1.1 0.7 - 3.1 x10E3/uL   Monocytes Absolute 1.1 (H) 0.1 - 0.9 x10E3/uL   EOS (ABSOLUTE) 0.1 0.0 - 0.4 x10E3/uL   Basophils Absolute 0.1 0.0 - 0.2 x10E3/uL   Immature Granulocytes 0 Not Estab. %   Immature Grans (Abs) 0.0 0.0 - 0.1 x10E3/uL  Comprehensive metabolic panel  Result Value Ref Range   Glucose 84 70 - 99 mg/dL   BUN 8 6 - 24 mg/dL   Creatinine, Ser 0.71 0.57 - 1.00 mg/dL   eGFR 98 >59 mL/min/1.73   BUN/Creatinine Ratio 11 9 - 23   Sodium 140 134 - 144 mmol/L   Potassium 4.4 3.5 - 5.2 mmol/L    Chloride 101 96 - 106 mmol/L   CO2 20 20 - 29 mmol/L   Calcium 9.9 8.7 - 10.2 mg/dL   Total Protein 7.9 6.0 - 8.5 g/dL   Albumin 4.8 3.8 - 4.9 g/dL   Globulin, Total 3.1 1.5 - 4.5 g/dL   Albumin/Globulin Ratio 1.5 1.2 - 2.2   Bilirubin Total 0.3 0.0 - 1.2 mg/dL   Alkaline Phosphatase 73 44 - 121 IU/L   AST 36 0 - 40 IU/L   ALT 25 0 - 32 IU/L  Lipid Panel w/o Chol/HDL Ratio  Result Value Ref Range   Cholesterol, Total 229 (H) 100 - 199 mg/dL   Triglycerides 129 0 - 149 mg/dL   HDL 65 >39 mg/dL   VLDL Cholesterol Cal 23 5 - 40 mg/dL   LDL Chol Calc (NIH) 141 (H) 0 - 99 mg/dL  Urinalysis, Routine w reflex microscopic  Result Value Ref Range   Specific Gravity, UA 1.010 1.005 - 1.030   pH, UA 5.5 5.0 - 7.5   Color, UA Yellow Yellow   Appearance Ur Clear Clear   Leukocytes,UA Negative Negative   Protein,UA Negative Negative/Trace   Glucose, UA Negative Negative   Ketones, UA Negative Negative   RBC, UA Negative Negative   Bilirubin, UA Negative Negative   Urobilinogen, Ur 0.2 0.2 - 1.0 mg/dL   Nitrite, UA Negative Negative   Microscopic Examination Comment   TSH  Result Value Ref Range   TSH 0.989 0.450 - 4.500 uIU/mL  VITAMIN D 25 Hydroxy (Vit-D Deficiency, Fractures)  Result Value Ref Range   Vit D, 25-Hydroxy 28.0 (L) 30.0 - 100.0 ng/mL  Veritor Flu A/B Waived  Result Value Ref Range   Influenza A Negative Negative   Influenza B Negative Negative      Assessment & Plan:   Problem List Items Addressed This Visit   None Visit Diagnoses     Acute non-recurrent frontal sinusitis    -  Primary   Complete course of antibiotics. Stay well hydrated.  Continue   Relevant Medications   amoxicillin (AMOXIL) 500 MG capsule        Follow up plan: No follow-ups on file.  This visit was completed via MyChart due to the restrictions of the COVID-19 pandemic. All issues as above were discussed and addressed. Physical exam was done as above through visual confirmation on  MyChart. If it was felt that the patient should be evaluated in the office, they were directed there. The patient verbally consented to this visit. Location of the patient:  Home Location of the provider: Office Those involved with this call:  Provider: Jon Billings, NP CMA: Valinda Hoar, [place] Desk/Registration: Lynnell Catalan This encounter was conducted via video.  I spent 20 dedicated to the care of this patient on the date of this encounter to include previsit review of symptoms, plan of care and medications, face to face time with the patient, and post visit ordering of testing.

## 2022-03-03 ENCOUNTER — Ambulatory Visit (INDEPENDENT_AMBULATORY_CARE_PROVIDER_SITE_OTHER): Payer: BC Managed Care – PPO | Admitting: Dermatology

## 2022-03-03 ENCOUNTER — Ambulatory Visit: Payer: Self-pay | Admitting: *Deleted

## 2022-03-03 DIAGNOSIS — R234 Changes in skin texture: Secondary | ICD-10-CM | POA: Diagnosis not present

## 2022-03-03 DIAGNOSIS — L988 Other specified disorders of the skin and subcutaneous tissue: Secondary | ICD-10-CM

## 2022-03-03 DIAGNOSIS — K130 Diseases of lips: Secondary | ICD-10-CM

## 2022-03-03 DIAGNOSIS — Z79899 Other long term (current) drug therapy: Secondary | ICD-10-CM

## 2022-03-03 MED ORDER — TACROLIMUS 0.1 % EX OINT: TOPICAL_OINTMENT | CUTANEOUS | 1 refills | Status: DC

## 2022-03-03 NOTE — Telephone Encounter (Signed)
She was given an antibiotic on 11/20. If she is worse or not better, will need to be seen

## 2022-03-03 NOTE — Progress Notes (Signed)
   Follow-Up Visit   Subjective  Sandra Chandler is a 61 y.o. female who presents for the following: Facial Elastosis (Botox today). She also has a split on her lower lip she would like evaluated today.  The following portions of the chart were reviewed this encounter and updated as appropriate:   Tobacco  Allergies  Meds  Problems  Med Hx  Surg Hx  Fam Hx     Review of Systems:  No other skin or systemic complaints except as noted in HPI or Assessment and Plan.  Objective  Well appearing patient in no apparent distress; mood and affect are within normal limits.  A focused examination was performed including face. Relevant physical exam findings are noted in the Assessment and Plan.  Face Rhytides and volume loss.            Mid Lower Vermilion Lip Fissure   Assessment & Plan  Elastosis of skin Face Botox today   Frown complex 27.5 units  Recheck on follow up - may consider adding to forehead and or brow lift  Botox Injection - Face Location: See attached image  Informed consent: Discussed risks (infection, pain, bleeding, bruising, swelling, allergic reaction, paralysis of nearby muscles, eyelid droop, double vision, neck weakness, difficulty breathing, headache, undesirable cosmetic result, and need for additional treatment) and benefits of the procedure, as well as the alternatives.  Informed consent was obtained.  Preparation: The area was cleansed with alcohol.  Procedure Details:  Botox was injected into the dermis with a 30-gauge needle. Pressure applied to any bleeding. Ice packs offered for swelling.  Lot Number:  [id number] Expiration:  04/2024  Total Units Injected:  27.5  Plan: Patient was instructed to remain upright for 4 hours. Patient was instructed to avoid massaging the face and avoid vigorous exercise for the rest of the day. Tylenol may be used for headache.  Allow 2 weeks before returning to clinic for additional dosing as needed.  Patient will call for any problems.  Cheilitis with fissure Mid Lower Vermilion Lip Start Tacrolimus 0.1% oint 1-3 times daily  tacrolimus (PROTOPIC) 0.1 % ointment - Mid Lower Vermilion Lip Apply topically as directed. 1-3 times daily  Return for 2-4 weeks, Botox follow up.  I, Ashok Cordia, CMA, am acting as scribe for Sarina Ser, MD . Documentation: I have reviewed the above documentation for accuracy and completeness, and I agree with the above.  Sarina Ser, MD

## 2022-03-03 NOTE — Telephone Encounter (Signed)
  Chief Complaint: shortness of breath with exertion requesting medication prednisone for sx  Symptoms: last seen OV 02/11/22 per patient for same issues now worsening. Shortness of breath with exertion, sneezing green mucus noted. Sinus pain face. Reports prednisone not called in from last OV. Antibiotic taken but sx continue.  Frequency: on going since 02/11/22 Pertinent Negatives: Patient denies chest pain no difficulty breathing no fever Disposition: '[]'$ ED /'[x]'$ Urgent Care (no appt availability in office) / '[]'$ Appointment(In office/virtual)/ '[]'$  [facility]/ '[]'$ Home Care/ '[x]'$ Refused Recommended Disposition /'[]'$ [facility]/ '[x]'$  Follow-up with PCP Additional Notes:   Recommended appt / UC. Patient reports she has been seen by PCP x 2 for this issue. Does patient need another OV? Requesting antibiotic and prednisone to get rid of sx. Please advise.       Reason for Disposition  [1] MILD difficulty breathing (e.g., minimal/no SOB at rest, SOB with walking, pulse <100) AND [2] NEW-onset or WORSE than normal  Answer Assessment - Initial Assessment Questions 1. RESPIRATORY STATUS: "Describe your breathing?" (e.g., wheezing, shortness of breath, unable to speak, severe coughing)      Shortness of breath with exertion, sneezing , green sputum from sneezing pressure in face  2. ONSET: "When did this breathing problem begin?"      On going since 02/11/22. Now worse 3. PATTERN "Does the difficult breathing come and go, or has it been constant since it started?"      Constant  4. SEVERITY: "How bad is your breathing?" (e.g., mild, moderate, severe)    - MILD: No SOB at rest, mild SOB with walking, speaks normally in sentences, can lie down, no retractions, pulse < 100.    - MODERATE: SOB at rest, SOB with minimal exertion and prefers to sit, cannot lie down flat, speaks in phrases, mild retractions, audible wheezing, pulse 100-120.    - SEVERE: Very SOB at rest, speaks in single  words, struggling to breathe, sitting hunched forward, retractions, pulse > 120      Mild  5. RECURRENT SYMPTOM: "Have you had difficulty breathing before?" If Yes, ask: "When was the last time?" and "What happened that time?"      Yes shortness of breath breathing through nose 6. CARDIAC HISTORY: "Do you have any history of heart disease?" (e.g., heart attack, angina, bypass surgery, angioplasty)      na 7. LUNG HISTORY: "Do you have any history of lung disease?"  (e.g., pulmonary embolus, asthma, emphysema)     Na  8. CAUSE: "What do you think is causing the breathing problem?"      Same issues as seen for in 11/15/3 9. OTHER SYMPTOMS: "Do you have any other symptoms? (e.g., dizziness, runny nose, cough, chest pain, fever)     Shortness of breath with exertion, sinus pain in face, night time worse. Sneezing green mucus noted  10. O2 SATURATION MONITOR:  "Do you use an oxygen saturation monitor (pulse oximeter) at home?" If Yes, ask: "What is your reading (oxygen level) today?" "What is your usual oxygen saturation reading?" (e.g., 95%)       na 11. PREGNANCY: "Is there any chance you are pregnant?" "When was your last menstrual period?"       na 12. TRAVEL: "Have you traveled out of the country in the last month?" (e.g., travel history, exposures)       na  Protocols used: Breathing Difficulty-A-AH

## 2022-03-03 NOTE — Telephone Encounter (Signed)
Called patient to let her know that per Dr. Wynetta Emery she needs to schedule an appointment. She hung up while I was talking when I let her know that Dr. Wynetta Emery will need to see her before she can call her in anything

## 2022-03-03 NOTE — Patient Instructions (Signed)
Due to recent changes in healthcare laws, you may see results of your pathology and/or laboratory studies on MyChart before the doctors have had a chance to review them. We understand that in some cases there may be results that are confusing or concerning to you. Please understand that not all results are received at the same time and often the doctors may need to interpret multiple results in order to provide you with the best plan of care or course of treatment. Therefore, we ask that you please give us 2 business days to thoroughly review all your results before contacting the office for clarification. Should we see a critical lab result, you will be contacted sooner.   If You Need Anything After Your Visit  If you have any questions or concerns for your doctor, please call our main line at [phone] and press option 4 to reach your doctor's medical assistant. If no one answers, please leave a voicemail as directed and we will return your call as soon as possible. Messages left after 4 pm will be answered the following business day.   You may also send us a message via MyChart. We typically respond to MyChart messages within 1-2 business days.  For prescription refills, please ask your pharmacy to contact our office. Our fax number is [phone].  If you have an urgent issue when the clinic is closed that cannot wait until the next business day, you can page your doctor at the number below.    Please note that while we do our best to be available for urgent issues outside of office hours, we are not available 24/7.   If you have an urgent issue and are unable to reach us, you may choose to seek medical care at your doctor's office, retail clinic, urgent care center, or emergency room.  If you have a medical emergency, please immediately call 911 or go to the emergency department.  Pager Numbers  - Dr. Kowalski: [phone]  - Dr. Moye: [phone]  - Dr. Stewart:  [phone]  In the event of inclement weather, please call our main line at [phone] for an update on the status of any delays or closures.  Dermatology Medication Tips: Please keep the boxes that topical medications come in in order to help keep track of the instructions about where and how to use these. Pharmacies typically print the medication instructions only on the boxes and not directly on the medication tubes.   If your medication is too expensive, please contact our office at [phone] option 4 or send us a message through MyChart.   We are unable to tell what your co-pay for medications will be in advance as this is different depending on your insurance coverage. However, we may be able to find a substitute medication at lower cost or fill out paperwork to get insurance to cover a needed medication.   If a prior authorization is required to get your medication covered by your insurance company, please allow us 1-2 business days to complete this process.  Drug prices often vary depending on where the prescription is filled and some pharmacies may offer cheaper prices.  The website www.goodrx.com contains coupons for medications through different pharmacies. The prices here do not account for what the cost may be with help from insurance (it may be cheaper with your insurance), but the website can give you the price if you did not use any insurance.  - You can print the associated coupon and take it with   your prescription to the pharmacy.  - You may also stop by our office during regular business hours and pick up a GoodRx coupon card.  - If you need your prescription sent electronically to a different pharmacy, notify our office through [facility] MyChart or by phone at [phone] option 4.     Si Usted Necesita Algo Despus de Su Visita  Tambin puede enviarnos un mensaje a travs de MyChart. Por lo general respondemos a los mensajes de MyChart en el transcurso de 1 a 2  das hbiles.  Para renovar recetas, por favor pida a su farmacia que se ponga en contacto con nuestra oficina. Nuestro nmero de fax es el [phone].  Si tiene un asunto urgente cuando la clnica est cerrada y que no puede esperar hasta el siguiente da hbil, puede llamar/localizar a su doctor(a) al nmero que aparece a continuacin.   Por favor, tenga en cuenta que aunque hacemos todo lo posible para estar disponibles para asuntos urgentes fuera del horario de oficina, no estamos disponibles las 24 horas del da, los 7 das de la semana.   Si tiene un problema urgente y no puede comunicarse con nosotros, puede optar por buscar atencin mdica  en el consultorio de su doctor(a), en una clnica privada, en un centro de atencin urgente o en una sala de emergencias.  Si tiene una emergencia mdica, por favor llame inmediatamente al 911 o vaya a la sala de emergencias.  Nmeros de bper  - Dr. Kowalski: [phone]  - Dra. Moye: [phone]  - Dra. Stewart: [phone]  En caso de inclemencias del tiempo, por favor llame a nuestra lnea principal al [phone] para una actualizacin sobre el estado de cualquier retraso o cierre.  Consejos para la medicacin en dermatologa: Por favor, guarde las cajas en las que vienen los medicamentos de uso tpico para ayudarle a seguir las instrucciones sobre dnde y cmo usarlos. Las farmacias generalmente imprimen las instrucciones del medicamento slo en las cajas y no directamente en los tubos del medicamento.   Si su medicamento es muy caro, por favor, pngase en contacto con nuestra oficina llamando al [phone] y presione la opcin 4 o envenos un mensaje a travs de MyChart.   No podemos decirle cul ser su copago por los medicamentos por adelantado ya que esto es diferente dependiendo de la cobertura de su seguro. Sin embargo, es posible que podamos encontrar un medicamento sustituto a menor costo o llenar un formulario para que el  seguro cubra el medicamento que se considera necesario.   Si se requiere una autorizacin previa para que su compaa de seguros cubra su medicamento, por favor permtanos de 1 a 2 das hbiles para completar este proceso.  Los precios de los medicamentos varan con frecuencia dependiendo del lugar de dnde se surte la receta y alguna farmacias pueden ofrecer precios ms baratos.  El sitio web www.goodrx.com tiene cupones para medicamentos de diferentes farmacias. Los precios aqu no tienen en cuenta lo que podra costar con la ayuda del seguro (puede ser ms barato con su seguro), pero el sitio web puede darle el precio si no utiliz ningn seguro.  - Puede imprimir el cupn correspondiente y llevarlo con su receta a la farmacia.  - Tambin puede pasar por nuestra oficina durante el horario de atencin regular y recoger una tarjeta de cupones de GoodRx.  - Si necesita que su receta se enve electrnicamente a una farmacia diferente, informe a nuestra oficina a travs de MyChart de [facility]   o por telfono llamando al [phone] y presione la opcin 4.  

## 2022-03-11 ENCOUNTER — Encounter: Payer: BC Managed Care – PPO | Admitting: Family Medicine

## 2022-03-15 ENCOUNTER — Encounter: Payer: Self-pay | Admitting: Dermatology

## 2022-04-07 ENCOUNTER — Ambulatory Visit: Payer: BC Managed Care – PPO | Admitting: Dermatology

## 2022-04-07 ENCOUNTER — Other Ambulatory Visit: Payer: Self-pay | Admitting: Family Medicine

## 2022-04-07 DIAGNOSIS — E039 Hypothyroidism, unspecified: Secondary | ICD-10-CM

## 2022-04-07 MED ORDER — LEVOTHYROXINE SODIUM 50 MCG PO TABS: ORAL_TABLET | ORAL | 0 refills | Status: DC

## 2022-04-07 NOTE — Telephone Encounter (Signed)
Medication Refill - Medication: levothyroxine (SYNTHROID) 50 MCG tablet   Has the patient contacted their pharmacy? Yes.   Preferred Pharmacy (with phone number or street name):  [place], [place] Phone: [phone]  Fax: [phone]     Has the patient been seen for an appointment in the last year OR does the patient have an upcoming appointment? Yes.    Agent: Please be advised that RX refills may take up to 3 business days. We ask that you follow-up with your pharmacy.

## 2022-04-07 NOTE — Telephone Encounter (Signed)
Requested Prescriptions  Pending Prescriptions Disp Refills   levothyroxine (SYNTHROID) 50 MCG tablet 90 tablet 0    Sig: TAKE 1 TABLET EVERY DAY ON EMPTY STOMACHWITH A GLASS OF WATER AT LEAST 30-60 [place]     Endocrinology:  Hypothyroid Agents Passed - 04/07/2022  3:55 PM      Passed - TSH in normal range and within 360 days    TSH  Date Value Ref Range Status  02/11/2022 0.989 0.450 - 4.500 uIU/mL Final         Passed - Valid encounter within last 12 months    Recent Outpatient Visits           1 month ago Acute non-recurrent frontal sinusitis   [place], NP   1 month ago Adult hypothyroidism   [facility] [place], [place], DO   1 year ago Adult hypothyroidism   [facility] Steele Sizer, MD   1 year ago Blood in stool, frank   [facility] Kathrine Haddock, NP   1 year ago Mood disorder [facility])   [facility] Steele Sizer, MD

## 2022-07-14 ENCOUNTER — Ambulatory Visit (INDEPENDENT_AMBULATORY_CARE_PROVIDER_SITE_OTHER): Payer: Self-pay | Admitting: Dermatology

## 2022-07-14 VITALS — BP 135/85

## 2022-07-14 DIAGNOSIS — L988 Other specified disorders of the skin and subcutaneous tissue: Secondary | ICD-10-CM

## 2022-07-14 NOTE — Progress Notes (Unsigned)
   Follow-Up Visit   Subjective  Sandra Chandler is a 62 y.o. female who presents for the following: Botox for facial elastosis  The following portions of the chart were reviewed this encounter and updated as appropriate: medications, allergies, medical history  Review of Systems:  No other skin or systemic complaints except as noted in HPI or Assessment and Plan.  Objective  Well appearing patient in no apparent distress; mood and affect are within normal limits.  A focused examination was performed of the face.  Relevant physical exam findings are noted in the Assessment and Plan.  Injection map photo    Assessment & Plan    Facial Elastosis Botox 27.5 units injected today to: -Frown complex 27.5 units  Location: frown complex  Informed consent: Discussed risks (infection, pain, bleeding, bruising, swelling, allergic reaction, paralysis of nearby muscles, eyelid droop, double vision, neck weakness, difficulty breathing, headache, undesirable cosmetic result, and need for additional treatment) and benefits of the procedure, as well as the alternatives.  Informed consent was obtained.  Preparation: The area was cleansed with alcohol.  Procedure Details:  Botox was injected into the dermis with a 30-gauge needle. Pressure applied to any bleeding. Ice packs offered for swelling.  Lot Number:  [id number] Expiration:  06/2024  Total Units Injected:  27.5  Plan: Tylenol may be used for headache.  Allow 2 weeks before returning to clinic for additional dosing as needed. Patient will call for any problems.  Return for 3-30m Botox.  I, Ardis Rowan, RMA, am acting as scribe for Armida Sans, MD .  Documentation: I have reviewed the above documentation for accuracy and completeness, and I agree with the above.  Armida Sans, MD

## 2022-07-14 NOTE — Patient Instructions (Signed)
Due to recent changes in healthcare laws, you may see results of your pathology and/or laboratory studies on MyChart before the doctors have had a chance to review them. We understand that in some cases there may be results that are confusing or concerning to you. Please understand that not all results are received at the same time and often the doctors may need to interpret multiple results in order to provide you with the best plan of care or course of treatment. Therefore, we ask that you please give us 2 business days to thoroughly review all your results before contacting the office for clarification. Should we see a critical lab result, you will be contacted sooner.   If You Need Anything After Your Visit  If you have any questions or concerns for your doctor, please call our main line at [phone] and press option 4 to reach your doctor's medical assistant. If no one answers, please leave a voicemail as directed and we will return your call as soon as possible. Messages left after 4 pm will be answered the following business day.   You may also send us a message via MyChart. We typically respond to MyChart messages within 1-2 business days.  For prescription refills, please ask your pharmacy to contact our office. Our fax number is [phone].  If you have an urgent issue when the clinic is closed that cannot wait until the next business day, you can page your doctor at the number below.    Please note that while we do our best to be available for urgent issues outside of office hours, we are not available 24/7.   If you have an urgent issue and are unable to reach us, you may choose to seek medical care at your doctor's office, retail clinic, urgent care center, or emergency room.  If you have a medical emergency, please immediately call 911 or go to the emergency department.  Pager Numbers  - Dr. Kowalski: [phone]  - Dr. Moye: [phone]  - Dr. Stewart:  [phone]  In the event of inclement weather, please call our main line at [phone] for an update on the status of any delays or closures.  Dermatology Medication Tips: Please keep the boxes that topical medications come in in order to help keep track of the instructions about where and how to use these. Pharmacies typically print the medication instructions only on the boxes and not directly on the medication tubes.   If your medication is too expensive, please contact our office at [phone] option 4 or send us a message through MyChart.   We are unable to tell what your co-pay for medications will be in advance as this is different depending on your insurance coverage. However, we may be able to find a substitute medication at lower cost or fill out paperwork to get insurance to cover a needed medication.   If a prior authorization is required to get your medication covered by your insurance company, please allow us 1-2 business days to complete this process.  Drug prices often vary depending on where the prescription is filled and some pharmacies may offer cheaper prices.  The website www.goodrx.com contains coupons for medications through different pharmacies. The prices here do not account for what the cost may be with help from insurance (it may be cheaper with your insurance), but the website can give you the price if you did not use any insurance.  - You can print the associated coupon and take it with   your prescription to the pharmacy.  - You may also stop by our office during regular business hours and pick up a GoodRx coupon card.  - If you need your prescription sent electronically to a different pharmacy, notify our office through [facility] MyChart or by phone at [phone] option 4.     Si Usted Necesita Algo Despus de Su Visita  Tambin puede enviarnos un mensaje a travs de MyChart. Por lo general respondemos a los mensajes de MyChart en el transcurso de 1 a 2  das hbiles.  Para renovar recetas, por favor pida a su farmacia que se ponga en contacto con nuestra oficina. Nuestro nmero de fax es el [phone].  Si tiene un asunto urgente cuando la clnica est cerrada y que no puede esperar hasta el siguiente da hbil, puede llamar/localizar a su doctor(a) al nmero que aparece a continuacin.   Por favor, tenga en cuenta que aunque hacemos todo lo posible para estar disponibles para asuntos urgentes fuera del horario de oficina, no estamos disponibles las 24 horas del da, los 7 das de la semana.   Si tiene un problema urgente y no puede comunicarse con nosotros, puede optar por buscar atencin mdica  en el consultorio de su doctor(a), en una clnica privada, en un centro de atencin urgente o en una sala de emergencias.  Si tiene una emergencia mdica, por favor llame inmediatamente al 911 o vaya a la sala de emergencias.  Nmeros de bper  - Dr. Kowalski: [phone]  - Dra. Moye: [phone]  - Dra. Stewart: [phone]  En caso de inclemencias del tiempo, por favor llame a nuestra lnea principal al [phone] para una actualizacin sobre el estado de cualquier retraso o cierre.  Consejos para la medicacin en dermatologa: Por favor, guarde las cajas en las que vienen los medicamentos de uso tpico para ayudarle a seguir las instrucciones sobre dnde y cmo usarlos. Las farmacias generalmente imprimen las instrucciones del medicamento slo en las cajas y no directamente en los tubos del medicamento.   Si su medicamento es muy caro, por favor, pngase en contacto con nuestra oficina llamando al [phone] y presione la opcin 4 o envenos un mensaje a travs de MyChart.   No podemos decirle cul ser su copago por los medicamentos por adelantado ya que esto es diferente dependiendo de la cobertura de su seguro. Sin embargo, es posible que podamos encontrar un medicamento sustituto a menor costo o llenar un formulario para que el  seguro cubra el medicamento que se considera necesario.   Si se requiere una autorizacin previa para que su compaa de seguros cubra su medicamento, por favor permtanos de 1 a 2 das hbiles para completar este proceso.  Los precios de los medicamentos varan con frecuencia dependiendo del lugar de dnde se surte la receta y alguna farmacias pueden ofrecer precios ms baratos.  El sitio web www.goodrx.com tiene cupones para medicamentos de diferentes farmacias. Los precios aqu no tienen en cuenta lo que podra costar con la ayuda del seguro (puede ser ms barato con su seguro), pero el sitio web puede darle el precio si no utiliz ningn seguro.  - Puede imprimir el cupn correspondiente y llevarlo con su receta a la farmacia.  - Tambin puede pasar por nuestra oficina durante el horario de atencin regular y recoger una tarjeta de cupones de GoodRx.  - Si necesita que su receta se enve electrnicamente a una farmacia diferente, informe a nuestra oficina a travs de MyChart de [facility]   o por telfono llamando al [phone] y presione la opcin 4.  

## 2022-07-15 ENCOUNTER — Encounter: Payer: Self-pay | Admitting: Dermatology

## 2022-07-23 ENCOUNTER — Encounter: Payer: BC Managed Care – PPO | Admitting: Family Medicine

## 2022-07-24 ENCOUNTER — Other Ambulatory Visit (HOSPITAL_COMMUNITY)
Admission: RE | Admit: 2022-07-24 | Discharge: 2022-07-24 | Disposition: A | Discharge status: Home / Self Care | Payer: BC Managed Care – PPO | Source: Ambulatory Visit | Attending: Family Medicine | Admitting: Family Medicine

## 2022-07-24 ENCOUNTER — Ambulatory Visit (INDEPENDENT_AMBULATORY_CARE_PROVIDER_SITE_OTHER): Payer: BC Managed Care – PPO | Admitting: Family Medicine

## 2022-07-24 ENCOUNTER — Encounter: Payer: Self-pay | Admitting: Family Medicine

## 2022-07-24 VITALS — BP 127/79 | HR 103 | Temp 97.5°F | Ht 63.0 in | Wt 168.7 lb

## 2022-07-24 DIAGNOSIS — Z0000 Encounter for general adult medical examination without abnormal findings: Secondary | ICD-10-CM | POA: Diagnosis not present

## 2022-07-24 DIAGNOSIS — E785 Hyperlipidemia, unspecified: Secondary | ICD-10-CM

## 2022-07-24 DIAGNOSIS — F3342 Major depressive disorder, recurrent, in full remission: Secondary | ICD-10-CM

## 2022-07-24 DIAGNOSIS — Z1231 Encounter for screening mammogram for malignant neoplasm of breast: Secondary | ICD-10-CM

## 2022-07-24 DIAGNOSIS — E559 Vitamin D deficiency, unspecified: Secondary | ICD-10-CM | POA: Diagnosis not present

## 2022-07-24 DIAGNOSIS — R5382 Chronic fatigue, unspecified: Secondary | ICD-10-CM

## 2022-07-24 DIAGNOSIS — E039 Hypothyroidism, unspecified: Secondary | ICD-10-CM

## 2022-07-24 LAB — URINALYSIS, ROUTINE W REFLEX MICROSCOPIC
Bilirubin, UA: NEGATIVE
Glucose, UA: NEGATIVE
Ketones, UA: NEGATIVE
Leukocytes,UA: NEGATIVE
Nitrite, UA: NEGATIVE
Protein,UA: NEGATIVE
RBC, UA: NEGATIVE
Specific Gravity, UA: 1.015 (ref 1.005–1.030)
Urobilinogen, Ur: 0.2 mg/dL (ref 0.2–1.0)
pH, UA: 5.5 (ref 5.0–7.5)

## 2022-07-24 NOTE — Progress Notes (Unsigned)
BP 127/79   Pulse (!) 103   Temp (!) 97.5 F (36.4 C) (Oral)   Ht 5\' 3"  (1.6 m)   Wt 168 lb 11.2 oz (76.5 kg)   SpO2 98%   BMI 29.88 kg/m    Subjective:    Patient ID: Sandra Chandler, female    DOB: [birth year], 62 y.o.   MRN: [id number]  HPI: Sandra Chandler is a 62 y.o. female presenting on 07/24/2022 for comprehensive medical examination. Current medical complaints include:  HYPOTHYROIDISM Thyroid control status:{Blank single:19197::"controlled","uncontrolled","better","worse","exacerbated","stable"} Satisfied with current treatment? {Blank single:19197::"yes","no"} Medication side effects: {Blank single:19197::"yes","no"} Medication compliance: {Blank single:19197::"excellent compliance","good compliance","fair compliance","poor compliance"} Etiology of hypothyroidism:  Recent dose adjustment:{Blank single:19197::"yes","no"} Fatigue: {Blank single:19197::"yes","no"} Cold intolerance: {Blank single:19197::"yes","no"} Heat intolerance: {Blank single:19197::"yes","no"} Weight gain: {Blank single:19197::"yes","no"} Weight loss: {Blank single:19197::"yes","no"} Constipation: {Blank single:19197::"yes","no"} Diarrhea/loose stools: {Blank single:19197::"yes","no"} Palpitations: {Blank single:19197::"yes","no"} Lower extremity edema: {Blank single:19197::"yes","no"} Anxiety/depressed mood: {Blank single:19197::"yes","no"}  Menopausal Symptoms: no  Depression Screen done today and results listed below:     03/11/2021    8:01 AM 09/19/2020    2:11 PM 05/03/2020    3:26 PM 12/13/2019    8:47 AM 10/30/2019    9:17 AM  Depression screen PHQ 2/9  Decreased Interest 0 0 0 0 0  Down, Depressed, Hopeless 0 0 0 0 0  PHQ - 2 Score 0 0 0 0 0  Altered sleeping 0 0 1  1  Tired, decreased energy 0 0 0  1  Change in appetite 0 0 0  0  Feeling bad or failure about yourself  0 0 0  0  Trouble concentrating 0 0 1  1  Moving slowly or fidgety/restless 0 0 0  1  Suicidal thoughts 0 0 0  0   PHQ-9 Score 0 0 2  4  Difficult doing work/chores  Not difficult at all   Somewhat difficult     Past Medical History:  Past Medical History:  Diagnosis Date   Chondromalacia of knee    Depressed bipolar I disorder (HCC)    Herpes genitalis in women    Hx of basal cell carcinoma    Insomnia    Patellofemoral stress syndrome    Personal history of other malignant neoplasm of skin 11/29/2014   Formatting of this note might be different from the original.  Overview:   Dermatologist in [place]   Thyroid disease    Vitamin D deficiency     Surgical History:  Past Surgical History:  Procedure Laterality Date   AUGMENTATION MAMMAPLASTY Bilateral    age 22 redone @ 5 years ago   BILATERAL OOPHORECTOMY  05/2011   BREAST SURGERY  1984   CATARACT EXTRACTION W/PHACO Left 07/02/2020   Procedure: CATARACT EXTRACTION PHACO AND INTRAOCULAR LENS PLACEMENT (IOC) LEFT VIVITY 3.24 00:24.2;  Surgeon: Galen Manila, MD;  Location: [facility];  Service: Ophthalmology;  Laterality: Left;   CATARACT EXTRACTION W/PHACO Right 07/16/2020   Procedure: CATARACT EXTRACTION PHACO AND INTRAOCULAR LENS PLACEMENT (IOC) RIGHT VIVITY;  Surgeon: Galen Manila, MD;  Location: [facility];  Service: Ophthalmology;  Laterality: Right;  2.93 0:20.8   CESAREAN SECTION     x2   COLONOSCOPY WITH PROPOFOL N/A 02/03/2021   Procedure: COLONOSCOPY WITH PROPOFOL;  Surgeon: Wyline Mood, MD;  Location: [facility];  Service: Gastroenterology;  Laterality: N/A;   LAPAROSCOPIC OOPHERECTOMY Left    MOHS SURGERY  01/28/2013   ROTATOR CUFF REPAIR Right 10/12/2018   Dr. Benjaman Lobe   TUBAL LIGATION  Medications:  Current Outpatient Medications on File Prior to Visit  Medication Sig   albuterol (VENTOLIN HFA) 108 (90 Base) MCG/ACT inhaler Inhale 1-2 puffs into the lungs every 4 (four) hours as needed for wheezing or shortness of breath. Use 2-3 times daily routinely presently, then  return to as needed use as symptoms improve (Patient not taking: Reported on 07/24/2022)   busPIRone (BUSPAR) 5 MG tablet TAKE 1 TABLET BY MOUTH 2 TIMES DAILY AS NEEDED (Patient not taking: Reported on 07/24/2022)   estradiol (ESTRACE VAGINAL) 0.1 MG/GM vaginal cream Place 1 Applicatorful vaginally 3 (three) times a week. (Patient not taking: Reported on 07/24/2022)   levothyroxine (SYNTHROID) 50 MCG tablet TAKE 1 TABLET EVERY DAY ON EMPTY STOMACHWITH A GLASS OF WATER AT LEAST 30-60 MINBEFORE BREAKFAST (Patient not taking: Reported on 07/24/2022)   omeprazole (PRILOSEC) 40 MG capsule Take 1 capsule (40 mg total) by mouth daily. (Patient not taking: Reported on 07/24/2022)   tacrolimus (PROTOPIC) 0.1 % ointment Apply topically as directed. 1-3 times daily (Patient not taking: Reported on 07/24/2022)   valACYclovir (VALTREX) 500 MG tablet TAKE 1 TABLET BY MOUTH DAILY.TAKE 3 TIMES DAILY AS NEEDED FOR OUTBREAK (Patient not taking: Reported on 07/24/2022)   No current facility-administered medications on file prior to visit.    Allergies:  No Known Allergies  Social History:  Social History   Socioeconomic History   Marital status: Divorced    Spouse name: Not on file   Number of children: 2   Years of education: Not on file   Highest education level: 12th grade  Occupational History   Occupation: purchasing     Comment: [employer]  Tobacco Use   Smoking status: Never   Smokeless tobacco: Never  Vaping Use   Vaping Use: Never used  Substance and Sexual Activity   Alcohol use: Yes    Alcohol/week: 3.0 standard drinks of alcohol    Types: 1 Glasses of wine, 2 Cans of beer per week   Drug use: No   Sexual activity: Not Currently    Partners: Male    Comment: dating, but boyfriend ED  Other Topics Concern   Not on file  Social History Narrative   Son lives with her. .  Divorced, has two grown children.    Social Determinants of Health   Financial Resource Strain: Low Risk  (10/30/2019)    Overall Financial Resource Strain (CARDIA)    Difficulty of Paying Living Expenses: Not hard at all  Food Insecurity: No Food Insecurity (10/30/2019)   Hunger Vital Sign    Worried About Running Out of Food in the Last Year: Never true    Ran Out of Food in the Last Year: Never true  Transportation Needs: No Transportation Needs (10/30/2019)   PRAPARE - Administrator, Civil Service (Medical): No    Lack of Transportation (Non-Medical): No  Physical Activity: Sufficiently Active (10/30/2019)   Exercise Vital Sign    Days of Exercise per Week: 4 days    Minutes of Exercise per Session: 60 min  Stress: Stress Concern Present (10/30/2019)   [employer] of Occupational Health - Occupational Stress Questionnaire    Feeling of Stress : Very much  Social Connections: Moderately Integrated (10/30/2019)   Social Connection and Isolation Panel [NHANES]    Frequency of Communication with Friends and Family: More than three times a week    Frequency of Social Gatherings with Friends and Family: More than three times a week  Attends Religious Services: More than 4 times per year    Active Member of Clubs or Organizations: Yes    Attends Banker Meetings: More than 4 times per year    Marital Status: Divorced  Intimate Partner Violence: Not At Risk (10/30/2019)   Humiliation, Afraid, Rape, and Kick questionnaire    Fear of Current or Ex-Partner: No    Emotionally Abused: No    Physically Abused: No    Sexually Abused: No   Social History   Tobacco Use  Smoking Status Never  Smokeless Tobacco Never   Social History   Substance and Sexual Activity  Alcohol Use Yes   Alcohol/week: 3.0 standard drinks of alcohol   Types: 1 Glasses of wine, 2 Cans of beer per week    Family History:  Family History  Problem Relation Age of Onset   Osteoporosis Mother    Bipolar disorder Father    Cancer Father        Lung   Bipolar disorder Brother    Breast cancer Maternal  Grandmother 59   Melanoma Paternal Grandmother     Past medical history, surgical history, medications, allergies, family history and social history reviewed with patient today and changes made to appropriate areas of the chart.   Review of Systems  Constitutional:  Positive for malaise/fatigue. Negative for chills, diaphoresis, fever and weight loss.  HENT: Negative.    Eyes: Negative.   Respiratory: Negative.    Cardiovascular: Negative.   Gastrointestinal: Negative.   Genitourinary: Negative.   Musculoskeletal: Negative.   Skin: Negative.   Neurological: Negative.   Endo/Heme/Allergies:  Positive for environmental allergies. Negative for polydipsia. Does not bruise/bleed easily.  Psychiatric/Behavioral: Negative.     All other ROS negative except what is listed above and in the HPI.      Objective:    BP 127/79   Pulse (!) 103   Temp (!) 97.5 F (36.4 C) (Oral)   Ht 5\' 3"  (1.6 m)   Wt 168 lb 11.2 oz (76.5 kg)   SpO2 98%   BMI 29.88 kg/m   Wt Readings from Last 3 Encounters:  07/24/22 168 lb 11.2 oz (76.5 kg)  02/11/22 181 lb 6.4 oz (82.3 kg)  05/08/21 178 lb (80.7 kg)    Physical Exam Vitals and nursing note reviewed.  Constitutional:      General: She is not in acute distress.    Appearance: Normal appearance. She is not ill-appearing, toxic-appearing or diaphoretic.  HENT:     Head: Normocephalic and atraumatic.     Right Ear: Tympanic membrane, ear canal and external ear normal. There is no impacted cerumen.     Left Ear: Tympanic membrane, ear canal and external ear normal. There is no impacted cerumen.     Nose: Nose normal. No congestion or rhinorrhea.     Mouth/Throat:     Mouth: Mucous membranes are moist.     Pharynx: Oropharynx is clear. No oropharyngeal exudate or posterior oropharyngeal erythema.  Eyes:     General: No scleral icterus.       Right eye: No discharge.        Left eye: No discharge.     Extraocular Movements: Extraocular movements  intact.     Conjunctiva/sclera: Conjunctivae normal.     Pupils: Pupils are equal, round, and reactive to light.  Neck:     Vascular: No carotid bruit.  Cardiovascular:     Rate and Rhythm: Normal rate and regular rhythm.  Pulses: Normal pulses.     Heart sounds: No murmur heard.    No friction rub. No gallop.  Pulmonary:     Effort: Pulmonary effort is normal. No respiratory distress.     Breath sounds: Normal breath sounds. No stridor. No wheezing, rhonchi or rales.  Chest:     Chest wall: No tenderness.  Abdominal:     General: Abdomen is flat. Bowel sounds are normal. There is no distension.     Palpations: Abdomen is soft. There is no mass.     Tenderness: There is no abdominal tenderness. There is no right CVA tenderness, left CVA tenderness, guarding or rebound.     Hernia: No hernia is present.  Genitourinary:    Comments: Breast exams deferred with shared decision making Musculoskeletal:        General: No swelling, tenderness, deformity or signs of injury.     Cervical back: Normal range of motion and neck supple. No rigidity. No muscular tenderness.     Right lower leg: No edema.     Left lower leg: No edema.  Lymphadenopathy:     Cervical: No cervical adenopathy.  Skin:    General: Skin is warm and dry.     Capillary Refill: Capillary refill takes less than 2 seconds.     Coloration: Skin is not jaundiced or pale.     Findings: No bruising, erythema, lesion or rash.  Neurological:     General: No focal deficit present.     Mental Status: She is alert and oriented to person, place, and time. Mental status is at baseline.     Cranial Nerves: No cranial nerve deficit.     Sensory: No sensory deficit.     Motor: No weakness.     Coordination: Coordination normal.     Gait: Gait normal.     Deep Tendon Reflexes: Reflexes normal.  Psychiatric:        Mood and Affect: Mood normal.        Behavior: Behavior normal.        Thought Content: Thought content normal.         Judgment: Judgment normal.     Results for orders placed or performed in visit on 07/24/22  Urinalysis, Routine w reflex microscopic  Result Value Ref Range   Specific Gravity, UA 1.015 1.005 - 1.030   pH, UA 5.5 5.0 - 7.5   Color, UA Yellow Yellow   Appearance Ur Clear Clear   Leukocytes,UA Negative Negative   Protein,UA Negative Negative/Trace   Glucose, UA Negative Negative   Ketones, UA Negative Negative   RBC, UA Negative Negative   Bilirubin, UA Negative Negative   Urobilinogen, Ur 0.2 0.2 - 1.0 mg/dL   Nitrite, UA Negative Negative   Microscopic Examination Comment       Assessment & Plan:   Problem List Items Addressed This Visit       Endocrine   Adult hypothyroidism   Relevant Orders   CBC with Differential/Platelet   Comprehensive metabolic panel   TSH     Other   Major depressive disorder, recurrent, in full remission with anxious distress (HCC)   Relevant Orders   CBC with Differential/Platelet   Comprehensive metabolic panel   Vitamin D deficiency   Relevant Orders   CBC with Differential/Platelet   Comprehensive metabolic panel   Hyperlipidemia with target LDL less than 100   Relevant Orders   CBC with Differential/Platelet   Comprehensive metabolic panel   Lipid  Panel w/o Chol/HDL Ratio   Other Visit Diagnoses     Routine general medical examination at a health care facility    -  Primary   Relevant Orders   CBC with Differential/Platelet   Comprehensive metabolic panel   Lipid Panel w/o Chol/HDL Ratio   Cytology - PAP   Urinalysis, Routine w reflex microscopic (Completed)   TSH   Encounter for screening mammogram for malignant neoplasm of breast       Relevant Orders   MM 3D SCREENING MAMMOGRAM BILATERAL BREAST   Chronic fatigue       Relevant Orders   B12 and Folate Panel   Ferritin        Follow up plan: No follow-ups on file.   LABORATORY TESTING:  - Pap smear: pap done  IMMUNIZATIONS:   - Tdap: Tetanus  vaccination status reviewed: last tetanus booster within 10 years. - Influenza: Postponed to flu season - Pneumovax: Not applicable - Prevnar: Not applicable - COVID: Refused - HPV: Not applicable - Shingrix vaccine: Up to date  SCREENING: -Mammogram: Ordered today  - Colonoscopy: Up to date   PATIENT COUNSELING:   Advised to take 1 mg of folate supplement per day if capable of pregnancy.   Sexuality: Discussed sexually transmitted diseases, partner selection, use of condoms, avoidance of unintended pregnancy  and contraceptive alternatives.   Advised to avoid cigarette smoking.  I discussed with the patient that most people either abstain from alcohol or drink within safe limits (<=14/week and <=4 drinks/occasion for males, <=7/weeks and <= 3 drinks/occasion for females) and that the risk for alcohol disorders and other health effects rises proportionally with the number of drinks per week and how often a drinker exceeds daily limits.  Discussed cessation/primary prevention of drug use and availability of treatment for abuse.   Diet: Encouraged to adjust caloric intake to maintain  or achieve ideal body weight, to reduce intake of dietary saturated fat and total fat, to limit sodium intake by avoiding high sodium foods and not adding table salt, and to maintain adequate dietary potassium and calcium preferably from fresh fruits, vegetables, and low-fat dairy products.    stressed the importance of regular exercise  Injury prevention: Discussed safety belts, safety helmets, smoke detector, smoking near bedding or upholstery.   Dental health: Discussed importance of regular tooth brushing, flossing, and dental visits.    NEXT PREVENTATIVE PHYSICAL DUE IN 1 YEAR. No follow-ups on file.

## 2022-07-25 LAB — COMPREHENSIVE METABOLIC PANEL
ALT: 23 IU/L (ref 0–32)
AST: 28 IU/L (ref 0–40)
Albumin/Globulin Ratio: 1.5 (ref 1.2–2.2)
Albumin: 4.6 g/dL (ref 3.8–4.9)
Alkaline Phosphatase: 82 IU/L (ref 44–121)
BUN/Creatinine Ratio: 22 (ref 9–23)
BUN: 20 mg/dL (ref 6–24)
Bilirubin Total: <0.2 mg/dL (ref 0.0–1.2)
CO2: 19 mmol/L — ABNORMAL LOW (ref 20–29)
Calcium: 10 mg/dL (ref 8.7–10.2)
Chloride: 100 mmol/L (ref 96–106)
Creatinine, Ser: 0.91 mg/dL (ref 0.57–1.00)
Globulin, Total: 3.1 g/dL (ref 1.5–4.5)
Glucose: 81 mg/dL (ref 70–99)
Potassium: 4.2 mmol/L (ref 3.5–5.2)
Sodium: 137 mmol/L (ref 134–144)
Total Protein: 7.7 g/dL (ref 6.0–8.5)
eGFR: 73 mL/min/{1.73_m2} (ref >59)

## 2022-07-25 LAB — CBC WITH DIFFERENTIAL/PLATELET
Basophils Absolute: 0.1 10*3/uL (ref 0.0–0.2)
Basos: 0 %
EOS (ABSOLUTE): 0.1 10*3/uL (ref 0.0–0.4)
Eos: 1 %
Hematocrit: 38.4 % (ref 34.0–46.6)
Hemoglobin: 12.5 g/dL (ref 11.1–15.9)
Immature Grans (Abs): 0 10*3/uL (ref 0.0–0.1)
Immature Granulocytes: 0 %
Lymphocytes Absolute: 1.4 10*3/uL (ref 0.7–3.1)
Lymphs: 11 %
MCH: 29.5 pg (ref 26.6–33.0)
MCHC: 32.6 g/dL (ref 31.5–35.7)
MCV: 91 fL (ref 79–97)
Monocytes Absolute: 0.9 10*3/uL (ref 0.1–0.9)
Monocytes: 7 %
Neutrophils Absolute: 10.2 10*3/uL — ABNORMAL HIGH (ref 1.4–7.0)
Neutrophils: 81 %
Platelets: 345 10*3/uL (ref 150–450)
RBC: 4.24 x10E6/uL (ref 3.77–5.28)
RDW: 12.6 % (ref 11.7–15.4)
WBC: 12.7 10*3/uL — ABNORMAL HIGH (ref 3.4–10.8)

## 2022-07-25 LAB — LIPID PANEL W/O CHOL/HDL RATIO
Cholesterol, Total: 214 mg/dL — ABNORMAL HIGH (ref 100–199)
HDL: 61 mg/dL (ref >39)
LDL Chol Calc (NIH): 140 mg/dL — ABNORMAL HIGH (ref 0–99)
Triglycerides: 72 mg/dL (ref 0–149)
VLDL Cholesterol Cal: 13 mg/dL (ref 5–40)

## 2022-07-25 LAB — B12 AND FOLATE PANEL
Folate: 9 ng/mL (ref >3.0)
Vitamin B-12: 781 pg/mL (ref 232–1245)

## 2022-07-25 LAB — TSH: TSH: 1.05 u[IU]/mL (ref 0.450–4.500)

## 2022-07-25 LAB — FERRITIN: Ferritin: 264 ng/mL — ABNORMAL HIGH (ref 15–150)

## 2022-07-29 ENCOUNTER — Encounter: Payer: Self-pay | Admitting: Family Medicine

## 2022-07-29 LAB — CYTOLOGY - PAP
Comment: NEGATIVE
Diagnosis: NEGATIVE
High risk HPV: NEGATIVE

## 2022-07-29 NOTE — Assessment & Plan Note (Signed)
Rechecking labs today. Await results. Treat as needed.  °

## 2022-07-29 NOTE — Assessment & Plan Note (Signed)
Stable. Continue to monitor. Call with any concerns.  ?

## 2022-09-09 ENCOUNTER — Encounter: Payer: Self-pay | Admitting: Family Medicine

## 2022-09-10 NOTE — Telephone Encounter (Signed)
Called and spoke with patient regarding the message that she sent informed her that she will need an appointment in order to address her concerns.  Stated she was at work and would have to call office back later.

## 2022-10-13 ENCOUNTER — Ambulatory Visit: Payer: BC Managed Care – PPO | Admitting: Dermatology

## 2022-10-23 DIAGNOSIS — Z1231 Encounter for screening mammogram for malignant neoplasm of breast: Secondary | ICD-10-CM | POA: Diagnosis not present

## 2022-10-23 LAB — HM MAMMOGRAPHY

## 2022-10-26 ENCOUNTER — Ambulatory Visit (INDEPENDENT_AMBULATORY_CARE_PROVIDER_SITE_OTHER): Payer: Self-pay | Admitting: Dermatology

## 2022-10-26 ENCOUNTER — Encounter: Payer: Self-pay | Admitting: Dermatology

## 2022-10-26 DIAGNOSIS — L988 Other specified disorders of the skin and subcutaneous tissue: Secondary | ICD-10-CM

## 2022-10-26 NOTE — Patient Instructions (Signed)

## 2022-10-26 NOTE — Progress Notes (Signed)
   Follow-Up Visit   Subjective  Sandra Chandler is a 62 y.o. female who presents for the following: Botox for facial elastosis  The following portions of the chart were reviewed this encounter and updated as appropriate: medications, allergies, medical history  Review of Systems:  No other skin or systemic complaints except as noted in HPI or Assessment and Plan.  Objective  Well appearing patient in no apparent distress; mood and affect are within normal limits.  A focused examination was performed of the face.  Relevant physical exam findings are noted in the Assessment and Plan.  Injection map photo     Assessment & Plan    Facial Elastosis  Location: See attached image  Informed consent: Discussed risks (infection, pain, bleeding, bruising, swelling, allergic reaction, paralysis of nearby muscles, eyelid droop, double vision, neck weakness, difficulty breathing, headache, undesirable cosmetic result, and need for additional treatment) and benefits of the procedure, as well as the alternatives.  Informed consent was obtained.  Preparation: The area was cleansed with alcohol.  Procedure Details:  Botox was injected into the dermis with a 30-gauge needle. Pressure applied to any bleeding. Ice packs offered for swelling.  Lot Number:  [id number] Expiration:  08/2024  Total Units Injected:  27.5  Plan: Tylenol may be used for headache.  Allow 2 weeks before returning to clinic for additional dosing as needed. Patient will call for any problems.  Return in about 3 months (around 01/26/2023) for Botox.  I, Lawson Radar, CMA, am acting as scribe for Armida Sans, MD.  Documentation: I have reviewed the above documentation for accuracy and completeness, and I agree with the above.  Armida Sans, MD

## 2023-01-26 ENCOUNTER — Ambulatory Visit (INDEPENDENT_AMBULATORY_CARE_PROVIDER_SITE_OTHER): Payer: Self-pay | Admitting: Dermatology

## 2023-01-26 DIAGNOSIS — L988 Other specified disorders of the skin and subcutaneous tissue: Secondary | ICD-10-CM

## 2023-01-26 NOTE — Progress Notes (Signed)
   Follow-Up Visit   Subjective  Sandra Chandler is a 62 y.o. female who presents for the following: Botox for facial elastosis  The following portions of the chart were reviewed this encounter and updated as appropriate: medications, allergies, medical history  Review of Systems:  No other skin or systemic complaints except as noted in HPI or Assessment and Plan.  Objective  Well appearing patient in no apparent distress; mood and affect are within normal limits.  A focused examination was performed of the face.  Relevant physical exam findings are noted in the Assessment and Plan.      Assessment & Plan    Facial Elastosis  Location: See attached image  Informed consent: Discussed risks (infection, pain, bleeding, bruising, swelling, allergic reaction, paralysis of nearby muscles, eyelid droop, double vision, neck weakness, difficulty breathing, headache, undesirable cosmetic result, and need for additional treatment) and benefits of the procedure, as well as the alternatives.  Informed consent was obtained.  Preparation: The area was cleansed with alcohol.  Procedure Details:  Botox was injected into the dermis with a 30-gauge needle. Pressure applied to any bleeding. Ice packs offered for swelling.  Lot Number:  [id number] Expiration:  11/2024  Total Units Injected:  27.5  Plan: Tylenol may be used for headache.  Allow 2 weeks before returning to clinic for additional dosing as needed. Patient will call for any problems.  Return in about 4 months (around 05/28/2023) for Botox injections.  Maylene Roes, CMA, am acting as scribe for Armida Sans, MD .   Documentation: I have reviewed the above documentation for accuracy and completeness, and I agree with the above.  Armida Sans, MD

## 2023-01-26 NOTE — Patient Instructions (Signed)

## 2023-02-06 ENCOUNTER — Encounter: Payer: Self-pay | Admitting: Dermatology

## 2023-05-21 DIAGNOSIS — J019 Acute sinusitis, unspecified: Secondary | ICD-10-CM | POA: Diagnosis not present

## 2023-05-21 DIAGNOSIS — B9689 Other specified bacterial agents as the cause of diseases classified elsewhere: Secondary | ICD-10-CM | POA: Diagnosis not present

## 2023-06-01 ENCOUNTER — Ambulatory Visit: Payer: Self-pay | Admitting: Dermatology

## 2023-06-02 ENCOUNTER — Encounter: Payer: Self-pay | Admitting: Family Medicine

## 2023-06-02 ENCOUNTER — Telehealth: Payer: BC Managed Care – PPO | Admitting: Family Medicine

## 2023-06-02 VITALS — Ht 63.0 in | Wt 190.0 lb

## 2023-06-02 DIAGNOSIS — E663 Overweight: Secondary | ICD-10-CM

## 2023-06-02 MED ORDER — BUPROPION HCL ER (SR) 150 MG PO TB12
ORAL_TABLET | ORAL | 2 refills | Status: DC
Start: 2023-06-02 — End: 2023-07-26

## 2023-06-02 NOTE — Progress Notes (Signed)
 Ht 5\' 3"  (1.6 m)   Wt 190 lb (86.2 kg)   BMI 33.66 kg/m    Subjective:    Patient ID: Sandra Chandler, female    DOB: 22-Nov-1960, 63 y.o.   MRN: [id number]  HPI: Sandra Chandler is a 63 y.o. female  Chief Complaint  Patient presents with   Obesity    Patient states she would like to discuss weight loss medication. States she has never been on any past weight loss medication.    WEIGHT GAIN Duration: about a year Previous attempts at weight loss: yes- gym 3x a week, calorie counting Complications of obesity: Depression Peak weight: current (190) Weight loss goal: to be healthy Weight loss to date: none Requesting obesity pharmacotherapy: yes Current weight loss supplements/medications: no Previous weight loss supplements/meds: no  Relevant past medical, surgical, family and social history reviewed and updated as indicated. Interim medical history since our last visit reviewed. Allergies and medications reviewed and updated.  Review of Systems  Constitutional: Negative.   Respiratory: Negative.    Cardiovascular: Negative.   Gastrointestinal: Negative.   Musculoskeletal: Negative.   Psychiatric/Behavioral: Negative.      Per HPI unless specifically indicated above     Objective:    Ht 5\' 3"  (1.6 m)   Wt 190 lb (86.2 kg)   BMI 33.66 kg/m   Wt Readings from Last 3 Encounters:  06/02/23 190 lb (86.2 kg)  07/24/22 168 lb 11.2 oz (76.5 kg)  02/11/22 181 lb 6.4 oz (82.3 kg)    Physical Exam Vitals and nursing note reviewed.  Constitutional:      General: She is not in acute distress.    Appearance: Normal appearance. She is not ill-appearing, toxic-appearing or diaphoretic.  HENT:     Head: Normocephalic and atraumatic.     Right Ear: External ear normal.     Left Ear: External ear normal.     Nose: Nose normal.     Mouth/Throat:     Mouth: Mucous membranes are moist.     Pharynx: Oropharynx is clear.  Eyes:     General: No scleral icterus.       Right  eye: No discharge.        Left eye: No discharge.     Conjunctiva/sclera: Conjunctivae normal.     Pupils: Pupils are equal, round, and reactive to light.  Pulmonary:     Effort: Pulmonary effort is normal. No respiratory distress.     Comments: Speaking in full sentences Musculoskeletal:        General: Normal range of motion.     Cervical back: Normal range of motion.  Skin:    Coloration: Skin is not jaundiced or pale.     Findings: No bruising, erythema, lesion or rash.  Neurological:     Mental Status: She is alert and oriented to person, place, and time. Mental status is at baseline.  Psychiatric:        Mood and Affect: Mood normal.        Behavior: Behavior normal.        Thought Content: Thought content normal.        Judgment: Judgment normal.     Results for orders placed or performed in visit on 10/26/22  HM MAMMOGRAPHY   Collection Time: 10/23/22 12:00 AM  Result Value Ref Range   HM Mammogram 0-4 Bi-Rad 0-4 Bi-Rad, Self Reported Normal      Assessment & Plan:   Problem List Items Addressed  This Visit       Other   Overweight - Primary   Will start her on wellbutrin and check her thyroid. Follow up in 6 weeks at physical.       Relevant Orders   TSH     Follow up plan: Return for As scheduled.   This visit was completed via video visit through MyChart due to the restrictions of the COVID-19 pandemic. All issues as above were discussed and addressed. Physical exam was done as above through visual confirmation on video through MyChart. If it was felt that the patient should be evaluated in the office, they were directed there. The patient verbally consented to this visit. Location of the patient: home Location of the provider: home Those involved with this call:  Provider: Olevia Perches, DO CMA: Wilhemena Durie, CMA Front Desk/Registration:  Servando Snare   Time spent on call:  15 minutes with patient face to face via video conference. More than 50%  of this time was spent in counseling and coordination of care. 23 minutes total spent in review of patient's record and preparation of their chart.

## 2023-06-02 NOTE — Assessment & Plan Note (Signed)
 Will start her on wellbutrin and check her thyroid. Follow up in 6 weeks at physical.

## 2023-06-03 ENCOUNTER — Encounter: Payer: Self-pay | Admitting: Family Medicine

## 2023-07-26 ENCOUNTER — Ambulatory Visit (INDEPENDENT_AMBULATORY_CARE_PROVIDER_SITE_OTHER): Payer: Self-pay | Admitting: Family Medicine

## 2023-07-26 ENCOUNTER — Encounter: Payer: Self-pay | Admitting: Family Medicine

## 2023-07-26 VITALS — BP 129/83 | HR 69 | Ht 63.0 in | Wt 177.2 lb

## 2023-07-26 DIAGNOSIS — E559 Vitamin D deficiency, unspecified: Secondary | ICD-10-CM | POA: Diagnosis not present

## 2023-07-26 DIAGNOSIS — E039 Hypothyroidism, unspecified: Secondary | ICD-10-CM | POA: Diagnosis not present

## 2023-07-26 DIAGNOSIS — F3342 Major depressive disorder, recurrent, in full remission: Secondary | ICD-10-CM | POA: Diagnosis not present

## 2023-07-26 DIAGNOSIS — E785 Hyperlipidemia, unspecified: Secondary | ICD-10-CM

## 2023-07-26 DIAGNOSIS — Z6831 Body mass index (BMI) 31.0-31.9, adult: Secondary | ICD-10-CM

## 2023-07-26 DIAGNOSIS — Z0000 Encounter for general adult medical examination without abnormal findings: Secondary | ICD-10-CM

## 2023-07-26 DIAGNOSIS — Z1231 Encounter for screening mammogram for malignant neoplasm of breast: Secondary | ICD-10-CM

## 2023-07-26 MED ORDER — TIRZEPATIDE-WEIGHT MANAGEMENT 2.5 MG/0.5ML ~~LOC~~ SOLN: 2.5000 mg | SUBCUTANEOUS | 1 refills | Status: AC

## 2023-07-26 NOTE — Assessment & Plan Note (Signed)
 Rechecking labs today. Await results. Treat as needed.

## 2023-07-26 NOTE — Progress Notes (Signed)
 BP 129/83 (BP Location: Left Arm, Patient Position: Sitting, Cuff Size: Normal)   Pulse 69   Ht 5\' 3"  (1.6 m)   Wt 177 lb 3.2 oz (80.4 kg)   SpO2 97%   BMI 31.39 kg/m    Subjective:    Patient ID: Sandra Chandler, female    DOB: [birth year], 63 y.o.   MRN: [id number]  HPI: Sandra Chandler is a 62 y.o. female presenting on 07/26/2023 for comprehensive medical examination. Current medical complaints include:  HYPOTHYROIDISM Thyroid  control status:stable Satisfied with current treatment? no Medication side effects: N/A Fatigue: yes Cold intolerance: no Heat intolerance: no Weight gain: no Weight loss: yes Constipation: no Diarrhea/loose stools: no Palpitations: no Lower extremity edema: no Anxiety/depressed mood: no  OBESITY- Has been using compounded zepbound. Has been losing weight well. No side effects.  Duration:  Previous attempts at weight loss: yes Complications of obesity: depression Peak weight: 190lbs Weight loss goal: 150s Weight loss to date: 13lbs Requesting obesity pharmacotherapy: yes Current weight loss supplements/medications: yes Previous weight loss supplements/meds: no  Menopausal Symptoms: no  Depression Screen done today and results listed below:     07/26/2023    8:10 AM 03/11/2021    8:01 AM 09/19/2020    2:11 PM 05/03/2020    3:26 PM 12/13/2019    8:47 AM  Depression screen PHQ 2/9  Decreased Interest 0 0 0 0 0  Down, Depressed, Hopeless 0 0 0 0 0  PHQ - 2 Score 0 0 0 0 0  Altered sleeping 0 0 0 1   Tired, decreased energy 0 0 0 0   Change in appetite 0 0 0 0   Feeling bad or failure about yourself  0 0 0 0   Trouble concentrating 0 0 0 1   Moving slowly or fidgety/restless 0 0 0 0   Suicidal thoughts 0 0 0 0   PHQ-9 Score 0 0 0 2   Difficult doing work/chores   Not difficult at all      Past Medical History:  Past Medical History:  Diagnosis Date   Arthritis    Chondromalacia of knee    Depressed bipolar I disorder (HCC)     Herpes genitalis in women    Hx of basal cell carcinoma    Insomnia    Patellofemoral stress syndrome    Personal history of other malignant neoplasm of skin 11/29/2014   Formatting of this note might be different from the original.  Overview:   Dermatologist in [place]   Thyroid  disease    Vitamin D  deficiency     Surgical History:  Past Surgical History:  Procedure Laterality Date   AUGMENTATION MAMMAPLASTY Bilateral    age 21 redone @ 5 years ago   BILATERAL OOPHORECTOMY  05/2011   BREAST SURGERY  1984   CATARACT EXTRACTION W/PHACO Left 07/02/2020   Procedure: CATARACT EXTRACTION PHACO AND INTRAOCULAR LENS PLACEMENT (IOC) LEFT VIVITY 3.24 00:24.2;  Surgeon: Clair Crews, MD;  Location: [facility];  Service: Ophthalmology;  Laterality: Left;   CATARACT EXTRACTION W/PHACO Right 07/16/2020   Procedure: CATARACT EXTRACTION PHACO AND INTRAOCULAR LENS PLACEMENT (IOC) RIGHT VIVITY;  Surgeon: Clair Crews, MD;  Location: [facility];  Service: Ophthalmology;  Laterality: Right;  2.93 0:20.8   CESAREAN SECTION     x2   COLONOSCOPY WITH PROPOFOL  N/A 02/03/2021   Procedure: COLONOSCOPY WITH PROPOFOL ;  Surgeon: Luke Salaam, MD;  Location: [facility];  Service: Gastroenterology;  Laterality: N/A;  LAPAROSCOPIC OOPHERECTOMY Left    MOHS SURGERY  01/28/2013   ROTATOR CUFF REPAIR Right 10/12/2018   Dr. Gena Kemp   TUBAL LIGATION      Medications:  No current outpatient medications on file prior to visit.   No current facility-administered medications on file prior to visit.    Allergies:  No Known Allergies  Social History:  Social History   Socioeconomic History   Marital status: Divorced    Spouse name: Not on file   Number of children: 2   Years of education: Not on file   Highest education level: 12th grade  Occupational History   Occupation: purchasing     Comment: [employer]  Tobacco Use   Smoking status: Never   Smokeless  tobacco: Never  Vaping Use   Vaping status: Never Used  Substance and Sexual Activity   Alcohol use: Yes    Alcohol/week: 3.0 standard drinks of alcohol    Types: 1 Glasses of wine, 2 Cans of beer per week   Drug use: No   Sexual activity: Not Currently    Partners: Male    Birth control/protection: Post-menopausal    Comment: dating, but boyfriend ED  Other Topics Concern   Not on file  Social History Narrative   Son lives with her. .  Divorced, has two grown children.    Social Drivers of Health   Financial Resource Strain: Patient Declined (07/26/2023)   Overall Financial Resource Strain (CARDIA)    Difficulty of Paying Living Expenses: Patient declined  Food Insecurity: No Food Insecurity (07/26/2023)   Hunger Vital Sign    Worried About Running Out of Food in the Last Year: Never true    Ran Out of Food in the Last Year: Never true  Transportation Needs: No Transportation Needs (07/26/2023)   PRAPARE - Administrator, Civil Service (Medical): No    Lack of Transportation (Non-Medical): No  Physical Activity: Sufficiently Active (07/26/2023)   Exercise Vital Sign    Days of Exercise per Week: 4 days    Minutes of Exercise per Session: 50 min  Stress: No Stress Concern Present (07/26/2023)   [employer] of Occupational Health - Occupational Stress Questionnaire    Feeling of Stress : Not at all  Social Connections: Unknown (07/26/2023)   Social Connection and Isolation Panel [NHANES]    Frequency of Communication with Friends and Family: Patient declined    Frequency of Social Gatherings with Friends and Family: Patient declined    Attends Religious Services: Patient declined    Database administrator or Organizations: Patient declined    Attends Banker Meetings: Not on file    Marital Status: Divorced  Intimate Partner Violence: Not At Risk (07/26/2023)   Humiliation, Afraid, Rape, and Kick questionnaire    Fear of Current or Ex-Partner: No     Emotionally Abused: No    Physically Abused: No    Sexually Abused: No   Social History   Tobacco Use  Smoking Status Never  Smokeless Tobacco Never   Social History   Substance and Sexual Activity  Alcohol Use Yes   Alcohol/week: 3.0 standard drinks of alcohol   Types: 1 Glasses of wine, 2 Cans of beer per week    Family History:  Family History  Problem Relation Age of Onset   Osteoporosis Mother    Bipolar disorder Father    Cancer Father        Lung   Bipolar disorder Brother  Breast cancer Maternal Grandmother 60   Melanoma Paternal Grandmother     Past medical history, surgical history, medications, allergies, family history and social history reviewed with patient today and changes made to appropriate areas of the chart.   Review of Systems  Constitutional: Negative.   HENT: Negative.    Eyes: Negative.   Respiratory: Negative.    Cardiovascular: Negative.   Gastrointestinal: Negative.   Genitourinary: Negative.   Musculoskeletal:  Positive for myalgias. Negative for back pain, falls, joint pain and neck pain.       Stiffer when standing up  Skin: Negative.   Neurological: Negative.   Endo/Heme/Allergies: Negative.   Psychiatric/Behavioral: Negative.     All other ROS negative except what is listed above and in the HPI.      Objective:    BP 129/83 (BP Location: Left Arm, Patient Position: Sitting, Cuff Size: Normal)   Pulse 69   Ht 5\' 3"  (1.6 m)   Wt 177 lb 3.2 oz (80.4 kg)   SpO2 97%   BMI 31.39 kg/m   Wt Readings from Last 3 Encounters:  07/26/23 177 lb 3.2 oz (80.4 kg)  06/02/23 190 lb (86.2 kg)  07/24/22 168 lb 11.2 oz (76.5 kg)    Physical Exam Vitals and nursing note reviewed.  Constitutional:      General: She is not in acute distress.    Appearance: Normal appearance. She is obese. She is not ill-appearing, toxic-appearing or diaphoretic.  HENT:     Head: Normocephalic and atraumatic.     Right Ear: Tympanic membrane, ear  canal and external ear normal. There is no impacted cerumen.     Left Ear: Tympanic membrane, ear canal and external ear normal. There is no impacted cerumen.     Nose: Nose normal. No congestion or rhinorrhea.     Mouth/Throat:     Mouth: Mucous membranes are moist.     Pharynx: Oropharynx is clear. No oropharyngeal exudate or posterior oropharyngeal erythema.  Eyes:     General: No scleral icterus.       Right eye: No discharge.        Left eye: No discharge.     Extraocular Movements: Extraocular movements intact.     Conjunctiva/sclera: Conjunctivae normal.     Pupils: Pupils are equal, round, and reactive to light.  Neck:     Vascular: No carotid bruit.  Cardiovascular:     Rate and Rhythm: Normal rate and regular rhythm.     Pulses: Normal pulses.     Heart sounds: No murmur heard.    No friction rub. No gallop.  Pulmonary:     Effort: Pulmonary effort is normal. No respiratory distress.     Breath sounds: Normal breath sounds. No stridor. No wheezing, rhonchi or rales.  Chest:     Chest wall: No tenderness.  Abdominal:     General: Abdomen is flat. Bowel sounds are normal. There is no distension.     Palpations: Abdomen is soft. There is no mass.     Tenderness: There is no abdominal tenderness. There is no right CVA tenderness, left CVA tenderness, guarding or rebound.     Hernia: No hernia is present.  Genitourinary:    Comments: Breast and pelvic exams deferred with shared decision making Musculoskeletal:        General: No swelling, tenderness, deformity or signs of injury.     Cervical back: Normal range of motion and neck supple. No rigidity. No muscular tenderness.  Right lower leg: No edema.     Left lower leg: No edema.  Lymphadenopathy:     Cervical: No cervical adenopathy.  Skin:    General: Skin is warm and dry.     Capillary Refill: Capillary refill takes less than 2 seconds.     Coloration: Skin is not jaundiced or pale.     Findings: No bruising,  erythema, lesion or rash.  Neurological:     General: No focal deficit present.     Mental Status: She is alert and oriented to person, place, and time. Mental status is at baseline.     Cranial Nerves: No cranial nerve deficit.     Sensory: No sensory deficit.     Motor: No weakness.     Coordination: Coordination normal.     Gait: Gait normal.     Deep Tendon Reflexes: Reflexes normal.  Psychiatric:        Mood and Affect: Mood normal.        Behavior: Behavior normal.        Thought Content: Thought content normal.        Judgment: Judgment normal.     Results for orders placed or performed in visit on 10/26/22  HM MAMMOGRAPHY   Collection Time: 10/23/22 12:00 AM  Result Value Ref Range   HM Mammogram 0-4 Bi-Rad 0-4 Bi-Rad, Self Reported Normal      Assessment & Plan:   Problem List Items Addressed This Visit       Endocrine   Adult hypothyroidism   Rechecking labs today. Await results. Treat as needed.       Relevant Orders   TSH     Other   Major depressive disorder, recurrent, in full remission with anxious distress (HCC)   Doing well not on medicine. Continue to monitor. Call with any concerns.       Vitamin D  deficiency   Rechecking labs today. Await results. Treat as needed.       Relevant Orders   VITAMIN D  25 Hydroxy (Vit-D Deficiency, Fractures)   Hyperlipidemia with target LDL less than 100   Rechecking labs today. Await results. Treat as needed.       Relevant Orders   Lipid Panel w/o Chol/HDL Ratio   BMI 31.0-31.9,adult   Has been using compounded trizepezide and is down 13lbs. Unsure of dose. Will start her on zepbound and recheck in 3 months. Call with any concerns.       Other Visit Diagnoses       Routine general medical examination at a health care facility    -  Primary   Vaccines up to date/declined. Screening labs checked today. Pap, mammo and colonoscopy up to date. Continue diet and exercise. Call with any concerns.   Relevant  Orders   CBC with Differential/Platelet   Comprehensive metabolic panel with GFR   Lipid Panel w/o Chol/HDL Ratio   TSH     Encounter for screening mammogram for malignant neoplasm of breast       Mammogram ordered today- due in July.   Relevant Orders   MM 3D SCREENING MAMMOGRAM BILATERAL BREAST        Follow up plan: Return in about 3 months (around 10/25/2023).   LABORATORY TESTING:  - Pap smear: up to date  IMMUNIZATIONS:   - Tdap: Tetanus vaccination status reviewed: last tetanus booster within 10 years. - Influenza: Postponed to flu season - Pneumovax: Not applicable - Prevnar: Not applicable - COVID: Refused -  HPV: Not applicable - Shingrix  vaccine: Up to date  SCREENING: -Mammogram: Ordered today  - Colonoscopy: Up to date   PATIENT COUNSELING:   Advised to take 1 mg of folate supplement per day if capable of pregnancy.   Sexuality: Discussed sexually transmitted diseases, partner selection, use of condoms, avoidance of unintended pregnancy  and contraceptive alternatives.   Advised to avoid cigarette smoking.  I discussed with the patient that most people either abstain from alcohol or drink within safe limits (<=14/week and <=4 drinks/occasion for males, <=7/weeks and <= 3 drinks/occasion for females) and that the risk for alcohol disorders and other health effects rises proportionally with the number of drinks per week and how often a drinker exceeds daily limits.  Discussed cessation/primary prevention of drug use and availability of treatment for abuse.   Diet: Encouraged to adjust caloric intake to maintain  or achieve ideal body weight, to reduce intake of dietary saturated fat and total fat, to limit sodium intake by avoiding high sodium foods and not adding table salt, and to maintain adequate dietary potassium and calcium preferably from fresh fruits, vegetables, and low-fat dairy products.    stressed the importance of regular exercise  Injury  prevention: Discussed safety belts, safety helmets, smoke detector, smoking near bedding or upholstery.   Dental health: Discussed importance of regular tooth brushing, flossing, and dental visits.    NEXT PREVENTATIVE PHYSICAL DUE IN 1 YEAR. Return in about 3 months (around 10/25/2023).

## 2023-07-26 NOTE — Assessment & Plan Note (Signed)
Doing well not on medicine. Continue to monitor. Call with any concerns.

## 2023-07-26 NOTE — Assessment & Plan Note (Signed)
 Has been using compounded trizepezide and is down 13lbs. Unsure of dose. Will start her on zepbound and recheck in 3 months. Call with any concerns.

## 2023-07-27 ENCOUNTER — Encounter: Payer: Self-pay | Admitting: Family Medicine

## 2023-07-27 LAB — CBC WITH DIFFERENTIAL/PLATELET
Basophils Absolute: 0.1 10*3/uL (ref 0.0–0.2)
Basos: 1 %
EOS (ABSOLUTE): 0.2 10*3/uL (ref 0.0–0.4)
Eos: 3 %
Hematocrit: 40.5 % (ref 34.0–46.6)
Hemoglobin: 13.1 g/dL (ref 11.1–15.9)
Immature Grans (Abs): 0 10*3/uL (ref 0.0–0.1)
Immature Granulocytes: 1 %
Lymphocytes Absolute: 1.9 10*3/uL (ref 0.7–3.1)
Lymphs: 28 %
MCH: 29.7 pg (ref 26.6–33.0)
MCHC: 32.3 g/dL (ref 31.5–35.7)
MCV: 92 fL (ref 79–97)
Monocytes Absolute: 0.8 10*3/uL (ref 0.1–0.9)
Monocytes: 12 %
Neutrophils Absolute: 3.9 10*3/uL (ref 1.4–7.0)
Neutrophils: 55 %
Platelets: 315 10*3/uL (ref 150–450)
RBC: 4.41 x10E6/uL (ref 3.77–5.28)
RDW: 12.9 % (ref 11.7–15.4)
WBC: 6.8 10*3/uL (ref 3.4–10.8)

## 2023-07-27 LAB — COMPREHENSIVE METABOLIC PANEL WITH GFR
ALT: 21 IU/L (ref 0–32)
AST: 29 IU/L (ref 0–40)
Albumin: 4.9 g/dL (ref 3.8–4.9)
Alkaline Phosphatase: 78 IU/L (ref 44–121)
BUN/Creatinine Ratio: 12 (ref 12–28)
BUN: 8 mg/dL (ref 8–27)
Bilirubin Total: <0.2 mg/dL (ref 0.0–1.2)
CO2: 21 mmol/L (ref 20–29)
Calcium: 9.6 mg/dL (ref 8.7–10.3)
Chloride: 101 mmol/L (ref 96–106)
Creatinine, Ser: 0.69 mg/dL (ref 0.57–1.00)
Globulin, Total: 3 g/dL (ref 1.5–4.5)
Glucose: 82 mg/dL (ref 70–99)
Potassium: 4.1 mmol/L (ref 3.5–5.2)
Sodium: 137 mmol/L (ref 134–144)
Total Protein: 7.9 g/dL (ref 6.0–8.5)
eGFR: 99 mL/min/{1.73_m2} (ref >59)

## 2023-07-27 LAB — VITAMIN D 25 HYDROXY (VIT D DEFICIENCY, FRACTURES): Vit D, 25-Hydroxy: 35.5 ng/mL (ref 30.0–100.0)

## 2023-07-27 LAB — LIPID PANEL W/O CHOL/HDL RATIO
Cholesterol, Total: 231 mg/dL — ABNORMAL HIGH (ref 100–199)
HDL: 69 mg/dL (ref >39)
LDL Chol Calc (NIH): 147 mg/dL — ABNORMAL HIGH (ref 0–99)
Triglycerides: 89 mg/dL (ref 0–149)
VLDL Cholesterol Cal: 15 mg/dL (ref 5–40)

## 2023-07-27 LAB — TSH: TSH: 2.03 u[IU]/mL (ref 0.450–4.500)

## 2023-08-31 ENCOUNTER — Ambulatory Visit: Payer: Self-pay | Admitting: Dermatology

## 2023-09-28 ENCOUNTER — Ambulatory Visit: Payer: Self-pay | Admitting: Dermatology

## 2023-09-28 ENCOUNTER — Encounter: Payer: Self-pay | Admitting: Dermatology

## 2023-09-28 DIAGNOSIS — Z85828 Personal history of other malignant neoplasm of skin: Secondary | ICD-10-CM

## 2023-09-28 DIAGNOSIS — Z7189 Other specified counseling: Secondary | ICD-10-CM

## 2023-09-28 DIAGNOSIS — L719 Rosacea, unspecified: Secondary | ICD-10-CM

## 2023-09-28 DIAGNOSIS — L570 Actinic keratosis: Secondary | ICD-10-CM | POA: Diagnosis not present

## 2023-09-28 DIAGNOSIS — W908XXA Exposure to other nonionizing radiation, initial encounter: Secondary | ICD-10-CM

## 2023-09-28 DIAGNOSIS — Z8619 Personal history of other infectious and parasitic diseases: Secondary | ICD-10-CM

## 2023-09-28 DIAGNOSIS — B001 Herpesviral vesicular dermatitis: Secondary | ICD-10-CM | POA: Diagnosis not present

## 2023-09-28 DIAGNOSIS — Z79899 Other long term (current) drug therapy: Secondary | ICD-10-CM

## 2023-09-28 DIAGNOSIS — L578 Other skin changes due to chronic exposure to nonionizing radiation: Secondary | ICD-10-CM | POA: Diagnosis not present

## 2023-09-28 DIAGNOSIS — L988 Other specified disorders of the skin and subcutaneous tissue: Secondary | ICD-10-CM

## 2023-09-28 MED ORDER — METRONIDAZOLE 1 % EX GEL: Freq: Every day | CUTANEOUS | 11 refills | Status: AC

## 2023-09-28 MED ORDER — VALACYCLOVIR HCL 1 G PO TABS: ORAL_TABLET | ORAL | 11 refills | Status: AC

## 2023-09-28 NOTE — Patient Instructions (Addendum)
 Instructions for [facility] Medications  One or more of your medications was sent to the [facility] mail order compounding pharmacy. You will receive an email from them and can purchase the medicine through that link. It will then be mailed to your home at the address you confirmed. If for any reason you do not receive an email from them, please check your spam folder. If you still do not find the email, please let us know. [facility] phone number is [phone].       Due to recent changes in healthcare laws, you may see results of your pathology and/or laboratory studies on MyChart before the doctors have had a chance to review them. We understand that in some cases there may be results that are confusing or concerning to you. Please understand that not all results are received at the same time and often the doctors may need to interpret multiple results in order to provide you with the best plan of care or course of treatment. Therefore, we ask that you please give [place] 2 business days to thoroughly review all your results before contacting the office for clarification. Should we see a critical lab result, you will be contacted sooner.   If You Need Anything After Your Visit  If you have any questions or concerns for your doctor, please call our main line at [phone] and press option 4 to reach your doctor's medical assistant. If no one answers, please leave a voicemail as directed and we will return your call as soon as possible. Messages left after 4 pm will be answered the following business day.   You may also send [place] a message via MyChart. We typically respond to MyChart messages within 1-2 business days.  For prescription refills, please ask your pharmacy to contact our office. Our fax number is [phone].  If you have an urgent issue when the clinic is closed that cannot wait until the next business day, you can page your doctor at the number below.    Please note  that while we do our best to be available for urgent issues outside of office hours, we are not available 24/7.   If you have an urgent issue and are unable to reach [place], you may choose to seek medical care at your doctor's office, retail clinic, urgent care center, or emergency room.  If you have a medical emergency, please immediately call 911 or go to the emergency department.  Pager Numbers  - Dr. Gwen Pounds: [phone]  - Dr. Roseanne Reno: [phone]  - Dr. Katrinka Blazing: [phone]   In the event of inclement weather, please call our main line at [phone] for an update on the status of any delays or closures.  Dermatology Medication Tips: Please keep the boxes that topical medications come in in order to help keep track of the instructions about where and how to use these. Pharmacies typically print the medication instructions only on the boxes and not directly on the medication tubes.   If your medication is too expensive, please contact our office at [phone] option 4 or send [place] a message through MyChart.   We are unable to tell what your co-pay for medications will be in advance as this is different depending on your insurance coverage. However, we may be able to find a substitute medication at lower cost or fill out paperwork to get insurance to cover a needed medication.   If a prior authorization is required to get your medication covered by your  insurance company, please allow [place] 1-2 business days to complete this process.  Drug prices often vary depending on where the prescription is filled and some pharmacies may offer cheaper prices.  The website www.goodrx.com contains coupons for medications through different pharmacies. The prices here do not account for what the cost may be with help from insurance (it may be cheaper with your insurance), but the website can give you the price if you did not use any insurance.  - You can print the associated coupon and take it with your  prescription to the pharmacy.  - You may also stop by our office during regular business hours and pick up a GoodRx coupon card.  - If you need your prescription sent electronically to a different pharmacy, notify our office through [facility] or by phone at [phone] option 4.     Si Usted Necesita Algo Despus de Su Visita  Tambin puede enviarnos un mensaje a travs de Clinical cytogeneticist. Por lo general respondemos a los mensajes de MyChart en el transcurso de 1 a 2 das hbiles.  Para renovar recetas, por favor pida a su farmacia que se ponga en contacto con nuestra oficina. Annie Sable de fax es [place] [phone].  Si tiene un asunto urgente cuando la clnica est cerrada y que no puede esperar hasta el siguiente da hbil, puede llamar/localizar a su doctor(a) al nmero que aparece a continuacin.   Por favor, tenga en cuenta que aunque hacemos todo lo posible para estar disponibles para asuntos urgentes fuera del horario de [place], no estamos disponibles las 24 horas del da, los 7 [address]  [address] de la [place].   Si tiene un problema urgente y no puede comunicarse con nosotros, puede optar por buscar atencin mdica  en el consultorio de su doctor(a), en una clnica privada, en un centro de atencin urgente o en una sala de emergencias.  Si tiene Engineer, drilling, por favor llame inmediatamente al 911 o vaya a la sala de emergencias.  Nmeros de bper  - Dr. Gwen Pounds: [phone]  - Dra. Roseanne Reno: [phone]  - Dr. Katrinka Blazing: [phone]   En caso de inclemencias del tiempo, por favor llame a Lacy Duverney principal al [phone] para una actualizacin sobre el [place] de cualquier retraso o cierre.  Consejos para la medicacin en dermatologa: Por favor, guarde las cajas en las que vienen los medicamentos de uso tpico para ayudarle a seguir las instrucciones sobre dnde y cmo usarlos. Las farmacias generalmente imprimen las instrucciones del medicamento slo en las cajas y no  directamente en los tubos del [place].   Si su medicamento es muy caro, por favor, pngase en contacto con Rolm Gala llamando al [phone] y presione la opcin 4 o envenos un mensaje a travs de Clinical cytogeneticist.   No podemos decirle cul ser su copago por los medicamentos por adelantado ya que esto es diferente dependiendo de la cobertura de su seguro. Sin embargo, es posible que podamos encontrar un medicamento sustituto a Audiological scientist un formulario para que el seguro cubra el medicamento que se considera necesario.   Si se requiere una autorizacin previa para que su compaa de seguros [place] su medicamento, por favor permtanos de 1 a 2 das hbiles para completar [address]  Los precios de los medicamentos varan con frecuencia dependiendo del Environmental consultant de dnde se surte la receta y alguna farmacias pueden ofrecer precios ms baratos.  El sitio web www.goodrx.com tiene cupones para medicamentos de Health and safety inspector. Los precios aqu no tienen  en cuenta lo que podra costar con la ayuda del seguro (puede ser ms barato con su seguro), pero el sitio web puede darle el precio si no Visual merchandiser.  - Puede imprimir el cupn correspondiente y llevarlo con su receta a la farmacia.  - Tambin puede pasar por nuestra oficina durante el horario de atencin regular y Education officer, museum una tarjeta de cupones de GoodRx.  - Si necesita que su receta se enve electrnicamente a una farmacia diferente, informe a nuestra oficina a travs de MyChart de [place] o por telfono llamando al [phone] y presione la opcin 4.

## 2023-09-28 NOTE — Progress Notes (Signed)
 Follow-Up Visit   Subjective  Sandra Chandler is a 63 y.o. female who presents for the following: Botox for facial elastosis. The patient has spots, moles and lesions to be evaluated, some may be new or changing and the patient may have concern these could be cancer. Also hx of cold sores - recent outbreak and hx of past tx with Valtrex . Also hx of BCC L upper lip  S/P MOHS. C/O redness face.  The following portions of the chart were reviewed this encounter and updated as appropriate: medications, allergies, medical history  Review of Systems:  No other skin or systemic complaints except as noted in HPI or Assessment and Plan.  Objective  Well appearing patient in no apparent distress; mood and affect are within normal limits.  A focused examination was performed of the face.  Relevant physical exam findings are noted in the Assessment and Plan. L cheek x 1 Erythematous thin papules/macules with gritty scale.    Assessment & Plan   AK (ACTINIC KERATOSIS) L cheek x 1 Actinic keratoses are precancerous spots that appear secondary to cumulative UV radiation exposure/sun exposure over time. They are chronic with expected duration over 1 year. A portion of actinic keratoses will progress to squamous cell carcinoma of the skin. It is not possible to reliably predict which spots will progress to skin cancer and so treatment is recommended to prevent development of skin cancer.  Recommend daily broad spectrum sunscreen SPF 30+ to sun-exposed areas, reapply every 2 hours as needed.  Recommend staying in the shade or wearing long sleeves, sun glasses (UVA+UVB protection) and wide brim hats (4-inch brim around the entire circumference of the hat). Call for new or changing lesions.  Destruction of lesion - L cheek x 1 Complexity: simple   Destruction method: cryotherapy   Informed consent: discussed and consent obtained   Timeout:  patient name, date of birth, surgical site, and procedure  verified Lesion destroyed using liquid nitrogen: Yes   Region frozen until ice ball extended beyond lesion: Yes   Outcome: patient tolerated procedure well with no complications   Post-procedure details: wound care instructions given   HISTORY OF BASAL CELL CARCINOMA   ACTINIC SKIN DAMAGE   HX OF COLD SORES   ROSACEA   COUNSELING AND COORDINATION OF CARE   MEDICATION MANAGEMENT   ELASTOSIS OF SKIN    Facial Elastosis    Location: See attached image  Informed consent: Discussed risks (infection, pain, bleeding, bruising, swelling, allergic reaction, paralysis of nearby muscles, eyelid droop, double vision, neck weakness, difficulty breathing, headache, undesirable cosmetic result, and need for additional treatment) and benefits of the procedure, as well as the alternatives.  Informed consent was obtained.  Preparation: The area was cleansed with alcohol.  Procedure Details:  Botox was injected into the dermis with a 30-gauge needle. Pressure applied to any bleeding. Ice packs offered for swelling.  Lot Number:  [id number] Expiration:  07/2025  Total Units Injected:  27.5  Plan: Tylenol may be used for headache.  Allow 2 weeks before returning to clinic for additional dosing as needed. Patient will call for any problems.  ROSACEA Exam Mid face erythema with telangiectasias +/- scattered inflammatory papules Chronic and persistent condition with duration or expected duration over one year. Condition is bothersome/symptomatic for patient. Currently flared. Rosacea is a chronic progressive skin condition usually affecting the face of adults, causing redness and/or acne bumps. It is treatable but not curable. It sometimes affects the eyes (ocular rosacea)  as well. It may respond to topical and/or systemic medication and can flare with stress, sun exposure, alcohol, exercise, topical steroids (including hydrocortisone/cortisone 10) and some foods.  Daily application of broad  spectrum spf 30+ sunscreen to face is recommended to reduce flares. Treatment Plan Start Skin Medicinals rosacea mix BID.  HERPESVIRAL INFECTION (COLD SORES) Exam Healing crust L upper lip Chronic and persistent condition with duration or expected duration over one year. Condition is bothersome/symptomatic for patient. Currently flared. Herpes Simplex Virus = Cold Sores = Fever Blisters is a chronic recurring blistering; scabbing sore-producing viral infection that is recurrent usually in the same area triggered by stress, sun/UV exposure and trauma.  It is infectious and can be spread from person to person by direct contact.  It is not curable, but is treatable with topical and oral medication. Treatment Plan Take Valacyclovir  2 grams every 12 hours for 2 doses with a glass of water at the first sign of symptoms.  HISTORY OF BASAL CELL CARCINOMA OF THE SKIN - L upper lip, tx with Mohs - No evidence of recurrence today - Recommend regular full body skin exams - Recommend daily broad spectrum sunscreen SPF 30+ to sun-exposed areas, reapply every 2 hours as needed.  - Call if any new or changing lesions are noted between office visits  ACTINIC DAMAGE - chronic, secondary to cumulative UV radiation exposure/sun exposure over time - diffuse scaly erythematous macules with underlying dyspigmentation - Recommend daily broad spectrum sunscreen SPF 30+ to sun-exposed areas, reapply every 2 hours as needed.  - Recommend staying in the shade or wearing long sleeves, sun glasses (UVA+UVB protection) and wide brim hats (4-inch brim around the entire circumference of the hat). - Call for new or changing lesions.  Return in about 4 months (around 01/29/2024) for Botox injections and AK follow up. Pt needs TBSE.  She will schedule with female Derm - Dr Raymund or Dr Jackquline.  LILLETTE Sandra Chandler, CMA, am acting as scribe for Alm Rhyme, MD .  Documentation: I have reviewed the above documentation for  accuracy and completeness, and I agree with the above.  Alm Rhyme, MD

## 2024-02-01 ENCOUNTER — Encounter: Payer: Self-pay | Admitting: Dermatology

## 2024-02-01 ENCOUNTER — Ambulatory Visit (INDEPENDENT_AMBULATORY_CARE_PROVIDER_SITE_OTHER): Payer: Self-pay | Admitting: Dermatology

## 2024-02-01 DIAGNOSIS — L988 Other specified disorders of the skin and subcutaneous tissue: Secondary | ICD-10-CM

## 2024-02-01 NOTE — Patient Instructions (Signed)

## 2024-02-01 NOTE — Progress Notes (Unsigned)
   Follow-Up Visit   Subjective  Sandra Chandler is a 63 y.o. female who presents for the following: Botox for facial elastosis  The following portions of the chart were reviewed this encounter and updated as appropriate: medications, allergies, medical history  Review of Systems:  No other skin or systemic complaints except as noted in HPI or Assessment and Plan.  Objective  Well appearing patient in no apparent distress; mood and affect are within normal limits.  A focused examination was performed of the face.  Relevant physical exam findings are noted in the Assessment and Plan.  Before photos               Injection map photo***    Assessment & Plan    Facial Elastosis Botox  32.5units injected today to: - Frown complex 27.5 units - Forehead 5 units (have not had botox to forehead since 11/04/21, originally had 12.5 units in forehead then decreased to 5 units on 11/04/21 due to drooping brows) Discussed if patient does not get brow drop with 5 units in forehead and still has wrinkling, may consider adding 1.25 x 2 for a total of 7 units to forehead Location:  frown complex, forehead  Informed consent: Discussed risks (infection, pain, bleeding, bruising, swelling, allergic reaction, paralysis of nearby muscles, eyelid droop, double vision, neck weakness, difficulty breathing, headache, undesirable cosmetic result, and need for additional treatment) and benefits of the procedure, as well as the alternatives.  Informed consent was obtained.  Preparation: The area was cleansed with alcohol.  Procedure Details:  Botox was injected into the dermis with a 30-gauge needle. Pressure applied to any bleeding. Ice packs offered for swelling.  Lot Number:  [id number] Expiration:  10/27  Total Units Injected:  32.5  Plan: Tylenol may be used for headache.  Allow 2 weeks before returning to clinic for additional dosing as needed. Patient will call for any  problems.  Return for 3-4 wk f/u Recheck botox.  I, Grayce Saunas, RMA, am acting as scribe for Alm Rhyme, MD .   Documentation: I have reviewed the above documentation for accuracy and completeness, and I agree with the above.  Alm Rhyme, MD

## 2024-02-02 ENCOUNTER — Encounter: Payer: Self-pay | Admitting: Dermatology

## 2024-02-09 ENCOUNTER — Encounter

## 2024-02-10 ENCOUNTER — Encounter

## 2024-02-29 ENCOUNTER — Encounter: Payer: Self-pay | Admitting: Dermatology

## 2024-02-29 ENCOUNTER — Ambulatory Visit (INDEPENDENT_AMBULATORY_CARE_PROVIDER_SITE_OTHER): Payer: Self-pay | Admitting: Dermatology

## 2024-02-29 DIAGNOSIS — L988 Other specified disorders of the skin and subcutaneous tissue: Secondary | ICD-10-CM

## 2024-02-29 NOTE — Progress Notes (Signed)
   Follow-Up Visit   Subjective  Sandra Chandler is a 63 y.o. female who presents for the following: Botox for facial elastosis Patient here for follow up on botox she got 3 to 4 weeks ago. Last appt she received 32.5 units of botox, 27.5 units at frown complex and only 5 units injected into the forehead. Patient has been injected originally in past with 12.5 units in forehead but decreased due to drooping of brows. Discussed at last visit if no brow drop observed but still wrinkling in forehead today would consider adding 1.25 units x 2 to forehead.   The following portions of the chart were reviewed this encounter and updated as appropriate: medications, allergies, medical history  Review of Systems:  No other skin or systemic complaints except as noted in HPI or Assessment and Plan.  Objective  Well appearing patient in no apparent distress; mood and affect are within normal limits.  A focused examination was performed of the face.  Relevant physical exam findings are noted in the Assessment and Plan.  Injection map photo   New botox follow up photos without today's increase at forehead                     Assessment & Plan    Facial Elastosis Discussed with patient at last appointment if  patient does not get brow drop with 5 units in forehead and still has wrinkling, may consider adding 1.25 x 2 to forehead  Wrinkling observed today at forehead but no brow drop. Patient would like to add additional 1.25 x 2 units to forehead.  Also discussed if patient is happy after today's increase will add this to patients future botox pattern.  Giving the patient a total of 7.5 units at forehead  Injected today at forehead 2.5 units total 1.25 x 2 at forehead 3 cm above brow see updated photo   Location: See attached image  Informed consent: Discussed risks (infection, pain, bleeding, bruising, swelling, allergic reaction, paralysis of nearby muscles, eyelid droop,  double vision, neck weakness, difficulty breathing, headache, undesirable cosmetic result, and need for additional treatment) and benefits of the procedure, as well as the alternatives.  Informed consent was obtained.  Preparation: The area was cleansed with alcohol.  Procedure Details:  Botox was injected into the dermis with a 30-gauge needle. Pressure applied to any bleeding. Ice packs offered for swelling.  Lot Number:  [id number] Expiration:  01/2026  Total Units Injected:  2.5 units  Plan: Tylenol may be used for headache.  Allow 2 weeks before returning to clinic for additional dosing as needed. Patient will call for any problems.  Return for 2.5 month to 3 month botox .  IEleanor Blush, CMA, am acting as scribe for Alm Rhyme, MD.  Documentation: I have reviewed the above documentation for accuracy and completeness, and I agree with the above.  Alm Rhyme, MD

## 2024-02-29 NOTE — Patient Instructions (Signed)

## 2024-04-05 ENCOUNTER — Encounter

## 2024-04-20 ENCOUNTER — Ambulatory Visit: Payer: Self-pay

## 2024-04-20 NOTE — Telephone Encounter (Signed)
 FYI Only or Action Required?: FYI only for provider: appointment scheduled on 1/23.  Patient was last seen in primary care on 07/26/2023 by Vicci Duwaine SQUIBB, DO.  Called Nurse Triage reporting Belepharitis.  Symptoms began 2 weeks ago.  Interventions attempted: OTC medications: Saline eye gtts, allergy meds.  Symptoms are: stable.  Triage Disposition: See Physician Within 24 Hours  Patient/caregiver understands and will follow disposition?: Yes   Reason for Triage: patient states that both eye lids are swollen, itchy and red. Been happening for a couple weeks.   Reason for Disposition  [1] SEVERE eyelid swelling (i.e., shut or almost) AND [2] involves both eyes AND [3] itchy  Answer Assessment - Initial Assessment Questions 1. ONSET: When did the swelling start? (e.g., minutes, hours, days)     2 weeks ago 2. LOCATION: What part of the eyelid is swollen?     Upper lids 3. SEVERITY: How swollen is it? (e.g., describe; mild, moderate, or severe)     Mild to moderate 4. ITCHING: Is there any itching? If Yes, ask: How much?   (Scale 1-10; mild, moderate or severe)     Moderate itching 5. PAIN: Is the swelling painful to touch? If Yes, ask: How painful is it?   (Scale 1-10; mild, moderate or severe)     no 6. FEVER: Do you have a fever? If Yes, ask: What is it, how was it measured, and when did it start?      no 7. CAUSE: What do you think is causing the swelling?     allergies 8. RECURRENT SYMPTOM: Have you had eyelid swelling before? If Yes, ask: When was the last time? What happened that time?     no 9. OTHER SYMPTOMS: Do you have any other symptoms? (e.g., blurred vision, eye discharge, rash, runny nose)     Denies discharge  Protocols used: Eyelid Swelling-A-AH

## 2024-04-21 ENCOUNTER — Encounter: Payer: Self-pay | Admitting: Family Medicine

## 2024-04-21 ENCOUNTER — Ambulatory Visit: Admitting: Family Medicine

## 2024-04-21 VITALS — BP 122/81 | HR 96 | Temp 97.9°F | Resp 17 | Ht 62.99 in | Wt 141.4 lb

## 2024-04-21 DIAGNOSIS — H01119 Allergic dermatitis of unspecified eye, unspecified eyelid: Secondary | ICD-10-CM | POA: Diagnosis not present

## 2024-04-21 MED ORDER — HYDROCORTISONE 1 % EX OINT: 1.0000 | TOPICAL_OINTMENT | Freq: Two times a day (BID) | CUTANEOUS | 1 refills | Status: DC

## 2024-04-21 MED ORDER — METHYLPREDNISOLONE SODIUM SUCC 40 MG IJ SOLR
40.0000 mg | Freq: Once | INTRAMUSCULAR | Status: AC
  Administered 2024-04-21: 40 mg via INTRAMUSCULAR

## 2024-04-21 NOTE — Progress Notes (Signed)
 Sandra Chandler

## 2024-04-21 NOTE — Progress Notes (Signed)
 "  BP 122/81 (BP Location: Left Arm, Patient Position: Sitting, Cuff Size: Normal)   Pulse 96   Temp 97.9 F (36.6 C) (Oral)   Resp 17   Ht 5' 2.99 (1.6 m)   Wt 141 lb 6.4 oz (64.1 kg)   SpO2 98%   BMI 25.05 kg/m    Subjective:    Patient ID: Sandra Chandler, female    DOB: April 23, 1960, 64 y.o.   MRN: [id number]  HPI: Sandra Chandler is a 64 y.o. female  Chief Complaint  Patient presents with   Eye Swelling    Eye swelling, itchy, very dry for the past three weeks.    RASH Duration:  3 weeks  Location: eyelids  Itching: yes Burning: yes Redness: yes Oozing: no Scaling: no Blisters: no Painful: no Fevers: no Change in detergents/soaps/personal care products: over Christmas- but has stopped using it now Recent illness: no Recent travel:no History of same: no Context: stable Alleviating factors: lotion/moisturizer Treatments attempted:lotion/moisturizer Shortness of breath: no  Throat/tongue swelling: no Myalgias/arthralgias: no  Relevant past medical, surgical, family and social history reviewed and updated as indicated. Interim medical history since our last visit reviewed. Allergies and medications reviewed and updated.  Review of Systems  Constitutional: Negative.   Eyes:  Positive for redness. Negative for photophobia, pain, discharge, itching and visual disturbance.  Respiratory: Negative.    Cardiovascular: Negative.   Musculoskeletal: Negative.   Skin:  Positive for rash. Negative for color change, pallor and wound.  Psychiatric/Behavioral: Negative.      Per HPI unless specifically indicated above     Objective:    BP 122/81 (BP Location: Left Arm, Patient Position: Sitting, Cuff Size: Normal)   Pulse 96   Temp 97.9 F (36.6 C) (Oral)   Resp 17   Ht 5' 2.99 (1.6 m)   Wt 141 lb 6.4 oz (64.1 kg)   SpO2 98%   BMI 25.05 kg/m   Wt Readings from Last 3 Encounters:  04/21/24 141 lb 6.4 oz (64.1 kg)  07/26/23 177 lb 3.2 oz (80.4 kg)  06/02/23  190 lb (86.2 kg)    Physical Exam Vitals and nursing note reviewed.  Constitutional:      General: She is not in acute distress.    Appearance: Normal appearance. She is not ill-appearing, toxic-appearing or diaphoretic.  HENT:     Head: Normocephalic and atraumatic.     Right Ear: External ear normal.     Left Ear: External ear normal.     Nose: Nose normal.     Mouth/Throat:     Mouth: Mucous membranes are moist.     Pharynx: Oropharynx is clear.  Eyes:     General: No scleral icterus.       Right eye: No discharge.        Left eye: No discharge.     Extraocular Movements: Extraocular movements intact.     Conjunctiva/sclera: Conjunctivae normal.     Pupils: Pupils are equal, round, and reactive to light.  Cardiovascular:     Rate and Rhythm: Normal rate and regular rhythm.     Pulses: Normal pulses.     Heart sounds: Normal heart sounds. No murmur heard.    No friction rub. No gallop.  Pulmonary:     Effort: Pulmonary effort is normal. No respiratory distress.     Breath sounds: Normal breath sounds. No stridor. No wheezing, rhonchi or rales.  Chest:     Chest wall: No tenderness.  Musculoskeletal:  General: Normal range of motion.     Cervical back: Normal range of motion and neck supple.  Skin:    General: Skin is warm and dry.     Capillary Refill: Capillary refill takes less than 2 seconds.     Coloration: Skin is not jaundiced or pale.     Findings: Erythema (bilateral eye lids) present. No bruising, lesion or rash.  Neurological:     General: No focal deficit present.     Mental Status: She is alert and oriented to person, place, and time. Mental status is at baseline.  Psychiatric:        Mood and Affect: Mood normal.        Behavior: Behavior normal.        Thought Content: Thought content normal.        Judgment: Judgment normal.     Results for orders placed or performed in visit on 07/26/23  CBC with Differential/Platelet   Collection Time:  07/26/23  8:14 AM  Result Value Ref Range   WBC 6.8 3.4 - 10.8 x10E3/uL   RBC 4.41 3.77 - 5.28 x10E6/uL   Hemoglobin 13.1 11.1 - 15.9 g/dL   Hematocrit 59.4 65.9 - 46.6 %   MCV 92 79 - 97 fL   MCH 29.7 26.6 - 33.0 pg   MCHC 32.3 31.5 - 35.7 g/dL   RDW 87.0 88.2 - 84.5 %   Platelets 315 150 - 450 x10E3/uL   Neutrophils 55 Not Estab. %   Lymphs 28 Not Estab. %   Monocytes 12 Not Estab. %   Eos 3 Not Estab. %   Basos 1 Not Estab. %   Neutrophils Absolute 3.9 1.4 - 7.0 x10E3/uL   Lymphocytes Absolute 1.9 0.7 - 3.1 x10E3/uL   Monocytes Absolute 0.8 0.1 - 0.9 x10E3/uL   EOS (ABSOLUTE) 0.2 0.0 - 0.4 x10E3/uL   Basophils Absolute 0.1 0.0 - 0.2 x10E3/uL   Immature Granulocytes 1 Not Estab. %   Immature Grans (Abs) 0.0 0.0 - 0.1 x10E3/uL  Comprehensive metabolic panel with GFR   Collection Time: 07/26/23  8:14 AM  Result Value Ref Range   Glucose 82 70 - 99 mg/dL   BUN 8 8 - 27 mg/dL   Creatinine, Ser 9.30 0.57 - 1.00 mg/dL   eGFR 99 >40 fO/fpw/8.26   BUN/Creatinine Ratio 12 12 - 28   Sodium 137 134 - 144 mmol/L   Potassium 4.1 3.5 - 5.2 mmol/L   Chloride 101 96 - 106 mmol/L   CO2 21 20 - 29 mmol/L   Calcium 9.6 8.7 - 10.3 mg/dL   Total Protein 7.9 6.0 - 8.5 g/dL   Albumin 4.9 3.8 - 4.9 g/dL   Globulin, Total 3.0 1.5 - 4.5 g/dL   Bilirubin Total <9.7 0.0 - 1.2 mg/dL   Alkaline Phosphatase 78 44 - 121 IU/L   AST 29 0 - 40 IU/L   ALT 21 0 - 32 IU/L  Lipid Panel w/o Chol/HDL Ratio   Collection Time: 07/26/23  8:14 AM  Result Value Ref Range   Cholesterol, Total 231 (H) 100 - 199 mg/dL   Triglycerides 89 0 - 149 mg/dL   HDL 69 >60 mg/dL   VLDL Cholesterol Cal 15 5 - 40 mg/dL   LDL Chol Calc (NIH) 852 (H) 0 - 99 mg/dL  TSH   Collection Time: 07/26/23  8:14 AM  Result Value Ref Range   TSH 2.030 0.450 - 4.500 uIU/mL  VITAMIN D  25 Hydroxy (Vit-D  Deficiency, Fractures)   Collection Time: 07/26/23  8:14 AM  Result Value Ref Range   Vit D, 25-Hydroxy 35.5 30.0 - 100.0 ng/mL       Assessment & Plan:   Problem List Items Addressed This Visit   None Visit Diagnoses       Eyelid dermatitis, allergic/contact    -  Primary   Will treat with solumedrol and hydrocortisone. Call with any concerns or if not getting better. Continue to monitor.   Relevant Medications   methylPREDNISolone  sodium succinate (SOLU-MEDROL ) 40 mg/mL injection 40 mg (Completed)        Follow up plan: Return if symptoms worsen or fail to improve.      "

## 2024-05-02 ENCOUNTER — Encounter: Payer: Self-pay | Admitting: Family Medicine

## 2024-05-02 ENCOUNTER — Telehealth: Payer: Self-pay | Admitting: Family Medicine

## 2024-05-02 ENCOUNTER — Ambulatory Visit: Admitting: Family Medicine

## 2024-05-02 VITALS — BP 126/75 | HR 87 | Temp 98.0°F | Resp 16 | Ht 62.99 in | Wt 143.2 lb

## 2024-05-02 DIAGNOSIS — H01119 Allergic dermatitis of unspecified eye, unspecified eyelid: Secondary | ICD-10-CM | POA: Diagnosis not present

## 2024-05-02 MED ORDER — HYDROCORTISONE 1 % EX OINT: 1.0000 | TOPICAL_OINTMENT | Freq: Two times a day (BID) | CUTANEOUS | 1 refills | Status: DC

## 2024-05-02 MED ORDER — HYDROCORTISONE 1 % EX OINT: 1.0000 | TOPICAL_OINTMENT | Freq: Two times a day (BID) | CUTANEOUS | 1 refills | Status: AC

## 2024-05-02 NOTE — Telephone Encounter (Unsigned)
 Copied from CRM #[id number]. Topic: Clinical - Prescription Issue >> May 02, 2024  5:07 PM Santiya F wrote: Reason for CRM: [facility] pharmacy is calling in because they received the prescription hydrocortisone  1 % ointment [[id number]] and they do have the smaller tube. Pharmacy wants to know if they should fill the smaller tube. Please follow up with [facility].

## 2024-05-03 ENCOUNTER — Telehealth: Payer: Self-pay

## 2024-05-03 NOTE — Telephone Encounter (Signed)
 Spoke with patient before closing yesterday (05/02/24) and advised her that the pharmacy stated that her insurance was not covering her rx medication and that she had to pay out of pocket. I also let the patient know that the pharmacy stated that if she does not want to pay the $14.99 out of pocket cost that she could by the over the counter medication which is the same. Patient stated they had told her the same thing and that she bought the over the counter one and is going to try that because what they were trying to give her at the pharmacy was a big tub of the hydrocortisone  cream and she did not want that.

## 2024-05-08 ENCOUNTER — Encounter

## 2024-06-13 ENCOUNTER — Ambulatory Visit: Admitting: Dermatology

## 2024-07-28 ENCOUNTER — Encounter: Admitting: Family Medicine
